# Patient Record
Sex: Female | Born: 1995
Health system: Southern US, Community
[De-identification: ages and names within clinical notes are randomized; demographics above are authoritative.]

## PROBLEM LIST (undated history)

## (undated) ENCOUNTER — Inpatient Hospital Stay (HOSPITAL_COMMUNITY): Payer: Self-pay

## (undated) DIAGNOSIS — O2300 Infections of kidney in pregnancy, unspecified trimester: Secondary | ICD-10-CM

## (undated) DIAGNOSIS — B009 Herpesviral infection, unspecified: Secondary | ICD-10-CM

## (undated) DIAGNOSIS — A749 Chlamydial infection, unspecified: Secondary | ICD-10-CM

## (undated) DIAGNOSIS — K219 Gastro-esophageal reflux disease without esophagitis: Secondary | ICD-10-CM

## (undated) DIAGNOSIS — B958 Unspecified staphylococcus as the cause of diseases classified elsewhere: Secondary | ICD-10-CM

## (undated) DIAGNOSIS — L309 Dermatitis, unspecified: Secondary | ICD-10-CM

## (undated) DIAGNOSIS — J45909 Unspecified asthma, uncomplicated: Secondary | ICD-10-CM

## (undated) HISTORY — PX: HERNIA REPAIR: SHX51

## (undated) HISTORY — PX: NO PAST SURGERIES: SHX2092

## (undated) SURGERY — Surgical Case
Anesthesia: *Unknown

---
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
## 2014-03-02 LAB — OB RESULTS CONSOLE ANTIBODY SCREEN: Antibody Screen: NEGATIVE

## 2014-03-02 LAB — OB RESULTS CONSOLE RPR: RPR: NONREACTIVE

## 2014-03-02 LAB — OB RESULTS CONSOLE ABO/RH: RH Type: POSITIVE

## 2014-03-02 LAB — OB RESULTS CONSOLE GC/CHLAMYDIA
Chlamydia: NEGATIVE
Gonorrhea: NEGATIVE

## 2014-03-02 LAB — OB RESULTS CONSOLE HEPATITIS B SURFACE ANTIGEN: Hepatitis B Surface Ag: NEGATIVE

## 2014-03-02 LAB — OB RESULTS CONSOLE RUBELLA ANTIBODY, IGM: Rubella: IMMUNE

## 2014-03-02 LAB — OB RESULTS CONSOLE HIV ANTIBODY (ROUTINE TESTING): HIV: NONREACTIVE

## 2014-06-29 LAB — OB RESULTS CONSOLE GBS: GBS: POSITIVE

## 2014-07-16 ENCOUNTER — Encounter (HOSPITAL_COMMUNITY): Payer: Self-pay

## 2014-07-16 ENCOUNTER — Inpatient Hospital Stay (HOSPITAL_COMMUNITY)
Admission: AD | Admit: 2014-07-16 | Discharge: 2014-07-18 | DRG: 781 | Disposition: A | Discharge status: Home / Self Care | Payer: Medicaid Other | Source: Ambulatory Visit | Attending: Obstetrics and Gynecology | Admitting: Obstetrics and Gynecology

## 2014-07-16 DIAGNOSIS — O26899 Other specified pregnancy related conditions, unspecified trimester: Secondary | ICD-10-CM | POA: Diagnosis present

## 2014-07-16 DIAGNOSIS — N12 Tubulo-interstitial nephritis, not specified as acute or chronic: Secondary | ICD-10-CM | POA: Diagnosis present

## 2014-07-16 DIAGNOSIS — O99891 Other specified diseases and conditions complicating pregnancy: Secondary | ICD-10-CM | POA: Diagnosis present

## 2014-07-16 DIAGNOSIS — N883 Incompetence of cervix uteri: Secondary | ICD-10-CM | POA: Diagnosis present

## 2014-07-16 DIAGNOSIS — O26879 Cervical shortening, unspecified trimester: Secondary | ICD-10-CM | POA: Diagnosis present

## 2014-07-16 DIAGNOSIS — R102 Pelvic and perineal pain: Secondary | ICD-10-CM

## 2014-07-16 DIAGNOSIS — O368390 Maternal care for abnormalities of the fetal heart rate or rhythm, unspecified trimester, not applicable or unspecified: Secondary | ICD-10-CM | POA: Diagnosis present

## 2014-07-16 DIAGNOSIS — O2300 Infections of kidney in pregnancy, unspecified trimester: Secondary | ICD-10-CM | POA: Diagnosis present

## 2014-07-16 DIAGNOSIS — D72829 Elevated white blood cell count, unspecified: Secondary | ICD-10-CM | POA: Diagnosis present

## 2014-07-16 DIAGNOSIS — O2390 Unspecified genitourinary tract infection in pregnancy, unspecified trimester: Principal | ICD-10-CM | POA: Diagnosis present

## 2014-07-16 DIAGNOSIS — Z87440 Personal history of urinary (tract) infections: Secondary | ICD-10-CM

## 2014-07-16 DIAGNOSIS — O9989 Other specified diseases and conditions complicating pregnancy, childbirth and the puerperium: Secondary | ICD-10-CM

## 2014-07-16 HISTORY — DX: Infections of kidney in pregnancy, unspecified trimester: O23.00

## 2014-07-16 LAB — COMPREHENSIVE METABOLIC PANEL
ALT: 9 U/L (ref 0–35)
AST: 24 U/L (ref 0–37)
Albumin: 3.3 g/dL — ABNORMAL LOW (ref 3.5–5.2)
Alkaline Phosphatase: 88 U/L (ref 39–117)
Anion gap: 19 — ABNORMAL HIGH (ref 5–15)
BUN: 7 mg/dL (ref 6–23)
CO2: 18 mEq/L — ABNORMAL LOW (ref 19–32)
Calcium: 9 mg/dL (ref 8.4–10.5)
Chloride: 94 mEq/L — ABNORMAL LOW (ref 96–112)
Creatinine, Ser: 0.74 mg/dL (ref 0.50–1.10)
GFR calc Af Amer: >90 mL/min (ref >90)
GFR calc non Af Amer: >90 mL/min (ref >90)
Glucose, Bld: 72 mg/dL (ref 70–99)
Potassium: 4 mEq/L (ref 3.7–5.3)
Sodium: 131 mEq/L — ABNORMAL LOW (ref 137–147)
Total Bilirubin: 1 mg/dL (ref 0.3–1.2)
Total Protein: 6.9 g/dL (ref 6.0–8.3)

## 2014-07-16 LAB — URINALYSIS, ROUTINE W REFLEX MICROSCOPIC
Glucose, UA: NEGATIVE mg/dL
Hgb urine dipstick: NEGATIVE
Ketones, ur: >80 mg/dL — AB
Nitrite: NEGATIVE
Protein, ur: NEGATIVE mg/dL
Specific Gravity, Urine: 1.01 (ref 1.005–1.030)
Urobilinogen, UA: 1 mg/dL (ref 0.0–1.0)
pH: 6.5 (ref 5.0–8.0)

## 2014-07-16 LAB — WET PREP, GENITAL
Clue Cells Wet Prep HPF POC: NONE SEEN
Trich, Wet Prep: NONE SEEN
Yeast Wet Prep HPF POC: NONE SEEN

## 2014-07-16 LAB — CBC WITH DIFFERENTIAL/PLATELET
Basophils Absolute: 0 10*3/uL (ref 0.0–0.1)
Basophils Relative: 0 % (ref 0–1)
Eosinophils Absolute: 0 10*3/uL (ref 0.0–0.7)
Eosinophils Relative: 0 % (ref 0–5)
HCT: 34.4 % — ABNORMAL LOW (ref 36.0–46.0)
Hemoglobin: 11.8 g/dL — ABNORMAL LOW (ref 12.0–15.0)
Lymphocytes Relative: 5 % — ABNORMAL LOW (ref 12–46)
Lymphs Abs: 1.1 10*3/uL (ref 0.7–4.0)
MCH: 28.8 pg (ref 26.0–34.0)
MCHC: 34.3 g/dL (ref 30.0–36.0)
MCV: 83.9 fL (ref 78.0–100.0)
Monocytes Absolute: 1.4 10*3/uL — ABNORMAL HIGH (ref 0.1–1.0)
Monocytes Relative: 6 % (ref 3–12)
Neutro Abs: 22.3 10*3/uL — ABNORMAL HIGH (ref 1.7–7.7)
Neutrophils Relative %: 89 % — ABNORMAL HIGH (ref 43–77)
Platelets: 230 10*3/uL (ref 150–400)
RBC: 4.1 MIL/uL (ref 3.87–5.11)
RDW: 13.9 % (ref 11.5–15.5)
WBC: 24.8 10*3/uL — ABNORMAL HIGH (ref 4.0–10.5)

## 2014-07-16 LAB — URINE MICROSCOPIC-ADD ON

## 2014-07-16 LAB — FETAL FIBRONECTIN: Fetal Fibronectin: NEGATIVE

## 2014-07-16 MED ORDER — LACTATED RINGERS IV SOLN
INTRAVENOUS | Status: DC
  Administered 2014-07-17 – 2014-07-18 (×3): via INTRAVENOUS

## 2014-07-16 MED ORDER — ZOLPIDEM TARTRATE 5 MG PO TABS
5.0000 mg | ORAL_TABLET | Freq: Every evening | ORAL | Status: DC | PRN
Start: 2014-07-16 — End: 2014-07-18

## 2014-07-16 MED ORDER — DOCUSATE SODIUM 100 MG PO CAPS
100.0000 mg | ORAL_CAPSULE | Freq: Every day | ORAL | Status: DC
  Administered 2014-07-17 – 2014-07-18 (×2): 100 mg via ORAL
  Filled 2014-07-16 (×3): qty 1

## 2014-07-16 MED ORDER — PRENATAL MULTIVITAMIN CH
1.0000 | ORAL_TABLET | Freq: Every day | ORAL | Status: DC
  Administered 2014-07-17 – 2014-07-18 (×2): 1 via ORAL
  Filled 2014-07-16 (×3): qty 1

## 2014-07-16 MED ORDER — CEFAZOLIN SODIUM 1-5 GM-% IV SOLN
1.0000 g | Freq: Three times a day (TID) | INTRAVENOUS | Status: DC
  Administered 2014-07-16 – 2014-07-18 (×5): 1 g via INTRAVENOUS
  Filled 2014-07-16 (×6): qty 50

## 2014-07-16 MED ORDER — ACETAMINOPHEN 500 MG PO TABS
1000.0000 mg | ORAL_TABLET | Freq: Once | ORAL | Status: AC
  Administered 2014-07-16: 1000 mg via ORAL
  Filled 2014-07-16: qty 2

## 2014-07-16 MED ORDER — CALCIUM CARBONATE ANTACID 500 MG PO CHEW
2.0000 | CHEWABLE_TABLET | ORAL | Status: DC | PRN
  Filled 2014-07-16: qty 1
  Filled 2014-07-16: qty 2
  Filled 2014-07-16: qty 1

## 2014-07-16 MED ORDER — LACTATED RINGERS IV BOLUS (SEPSIS)
500.0000 mL | Freq: Once | INTRAVENOUS | Status: AC
  Administered 2014-07-16: 22:00:00 via INTRAVENOUS

## 2014-07-16 MED ORDER — ACETAMINOPHEN 325 MG PO TABS
650.0000 mg | ORAL_TABLET | ORAL | Status: DC | PRN
  Filled 2014-07-16: qty 2

## 2014-07-16 NOTE — H&P (Signed)
Cheryl Logan is a 18 y.o. female, G1P0 at 28.2 weeks, presenting for gradual onset abdominal and back pain.  Patient reports pain started two days ago and has been progressively worsening.  Patient reports history of UTI and noted some issues with urination including hesitancy and retention as well as some cloudy colored urine on one occasion.  Patient with positive history of CT in Feb 2015 and at [redacted]wks GA, both adequately treated.  Patient also suffered numerous UTI with most recent at [facility], on 06/26/2014, which cultured K. Pneumoniae. Patient denies history of kidney stones and reports active fetus. Patient also denies ctx, vb, and lof.  Patient family admits to some cold-like illnesses noted in one family member that patient was recently in contact with.  However, patient denies issues with cough, nasal drainage, sinuses, or diarrhea.  Patient does admit to some constipation aeb difficulty passing stool, but last bowel movement was this evening.   There are no active problems to display for this patient.   History of present pregnancy: Patient entered care at 8.5 weeks and transferred to [facility] at 26.3wks.   EDC of 10/06/2014 was established by LMP and confirmed by 8.1wk US   Anatomy scan:  20.1 weeks, with normal findings and an Unknown placenta.   Additional [place] evaluations:  None.   Significant prenatal events:  Patient treated for CT infection at 13wks, TOC negative. Patient with ear infection at 13 wks.  Patient c/o UTI type symptoms  Last evaluation:  07/03/2014  OB History   Grav Para Term Preterm Abortions TAB SAB Ect Mult Living   1              Past Medical History  Diagnosis Date  . Eczema   . Asthma   . Infection     UTI   Past Surgical History  Procedure Laterality Date  . No past surgeries     Family History: family history is not on file. Social History:  reports that she has never smoked. She does not have any smokeless tobacco history on file. She reports that she  does not drink alcohol or use illicit drugs.   Prenatal Transfer Tool  Maternal Diabetes: Unknown Genetic Screening: Normal Maternal Ultrasounds/Referrals: Normal Fetal Ultrasounds or other Referrals:  None Maternal Substance Abuse:  No Significant Maternal Medications:  None Significant Maternal Lab Results: None    ROS:  See HPI Above  Allergies  Allergen Reactions  . Shellfish Allergy Anaphylaxis  . Peanut-Containing Drug Products Swelling     Dilation: Closed Effacement (%): Thick Station: Ballotable Exam by:: Sabas Sous CNM Blood pressure 113/68, pulse 138, temperature 99.8 F (37.7 C), temperature source Axillary, resp. rate 18, height  (1.575 m), weight 135 lb (61.236 kg), last menstrual period 12/04/2013, SpO2 100.00%.  Chest clear Heart RRR without murmur Abd gravid, NT Pelvic: C/T/B Ext: WNL  FHR: 160 bpm, Mod Var, -Decels, +Accels UCs:  None  Prenatal labs: ABO, Rh:  B Positive Antibody:  Negative Rubella:   Immune RPR:   Non Reactive  HBsAg:   Negative HIV:   Negative  GBS:  Unknown Sickle cell/Hgb electrophoresis:  Normal Solubility  Pap:  N/A GC:  Negative Chlamydia: H/O at 13 wks, TOC Negative Genetic screenings:  Normal Glucola:  Not Yet Performed Other:  TSH-Normal, Drug Screen-Negative    Assessment IUP at 28.1wks Pelvic Pain Back Pain Fetal Tachycardia  Plan: Admit to Antepartum for observation per consult with Dr. Dorris Carnes. [place] Routine Antepartum Orders per  [facility] Guidelines Ancef 1gram Q 8 Hours PO Tylenol Q 4 Hours Influenza and Strep Cultures Repeat CBC with Differential in AM Continuous fetal monitoring GC/CT Pending Consider abdominal/renal US in AM, to r/o kidney stones, if symptoms persist  Alyssamarie Mounsey LYNNCNM, MSN 07/16/2014, 10:52 PM

## 2014-07-16 NOTE — MAU Provider Note (Signed)
History   Patient is a 18 y.o. G1P0 at 28.1wks who presents with complaint of fever, abdominal pain, and back pain.  Patient also reports issues with urination stating that she had "cloudy" urine yesterday, but it was yellow today.  Patient also states that she has pain with urination and describes it as "just pain" and denies burning or hesitancy.  Patient reports onset of pain in last two days with symptoms worsening today.  Patient reports active fetus and denies LOF, VB, and ctx.   There are no active problems to display for this patient.   No chief complaint on file.  HPI  OB History   Grav Para Term Preterm Abortions TAB SAB Ect Mult Living   1               Past Medical History  Diagnosis Date  . Eczema   . Asthma   . Infection     UTI    Past Surgical History  Procedure Laterality Date  . No past surgeries      History reviewed. No pertinent family history.  History  Substance Use Topics  . Smoking status: Never Smoker   . Smokeless tobacco: Not on file  . Alcohol Use: No    Allergies:  Allergies  Allergen Reactions  . Shellfish Allergy Anaphylaxis  . Peanut-Containing Drug Products Swelling    Prescriptions prior to admission  Medication Sig Dispense Refill  . prenatal vitamin w/FE, FA (PRENATAL 1 + 1) 27-1 MG TABS tablet Take 1 tablet by mouth daily at 12 noon.      Marland Kitchen albuterol (PROVENTIL) (2.5 MG/3ML) 0.083% nebulizer solution Take 2.5 mg by nebulization every 6 (six) hours as needed. For cough or wheezing       . albuterol (PROVENTIL) (5 MG/ML) 0.5% nebulizer solution Take 2.5 mg by nebulization every 6 (six) hours as needed. For cough or wheezing       . cephALEXin (KEFLEX) 500 MG capsule Take 1 capsule (500 mg total) by mouth 3 (three) times daily.  21 capsule  0  . medroxyPROGESTERone (DEPO-PROVERA) 150 MG/ML injection Inject 150 mg into the muscle every 3 (three) months.        . metroNIDAZOLE (FLAGYL) 500 MG tablet Take 1 tablet (500 mg total) by  mouth 2 (two) times daily.  14 tablet  0  . promethazine (PHENERGAN) 12.5 MG tablet Take 1 tablet (12.5 mg total) by mouth every 6 (six) hours as needed for nausea or vomiting.  30 tablet  0  . triamcinolone cream (KENALOG) 0.1 % Apply 1 application topically daily as needed. For eczema          ROS  See HPI Above Physical Exam   Blood pressure 113/68, pulse 138, temperature 99.8 F (37.7 C), temperature source Axillary, resp. rate 18, height  (1.575 m), weight 135 lb (61.236 kg), last menstrual period 12/04/2013, SpO2 100.00%.   Physical Exam  Constitutional: She is oriented to person, place, and time. She appears well-developed and well-nourished. No distress.  Cardiovascular: Normal rate, regular rhythm and normal heart sounds.   Respiratory: Effort normal and breath sounds normal.  GI: Soft. Bowel sounds are normal. There is no tenderness. There is CVA tenderness. There is no rigidity, no guarding and negative Murphy's sign.  Appears gravid--fundal height appropriate for GA. NT    Musculoskeletal:       Lumbar back: She exhibits tenderness and pain.  Neurological: She is alert and oriented to person, place, and time.  Skin: Skin is warm and dry.   FHR:165 bpm, Mod Var, -Decels, +Accels UC: None graphed, fundus soft  ED Course  Assessment: IUP at 28.1wks Cat I FT Tachycardia Abdominal Pain R/O Pyelonephritis   Plan: -Labs: UA -Start IV, LR bolus  Follow Up (2143) -Patient reporting increased pelvic pain and pressure -Labs: GC/CT, Wet Prep Collected -Tylenol for maternal fever -Dr. Audree Camel consulted and advised to admit patient for observation   [facility], Lundon Verdejo LYNN CNM, MSN 07/16/2014 9:08 PM

## 2014-07-17 ENCOUNTER — Observation Stay (HOSPITAL_COMMUNITY): Payer: Medicaid Other

## 2014-07-17 ENCOUNTER — Encounter (HOSPITAL_COMMUNITY): Payer: Self-pay | Admitting: *Deleted

## 2014-07-17 DIAGNOSIS — O2390 Unspecified genitourinary tract infection in pregnancy, unspecified trimester: Secondary | ICD-10-CM | POA: Diagnosis present

## 2014-07-17 DIAGNOSIS — O2300 Infections of kidney in pregnancy, unspecified trimester: Secondary | ICD-10-CM | POA: Diagnosis present

## 2014-07-17 DIAGNOSIS — N12 Tubulo-interstitial nephritis, not specified as acute or chronic: Secondary | ICD-10-CM | POA: Diagnosis present

## 2014-07-17 DIAGNOSIS — Z87440 Personal history of urinary (tract) infections: Secondary | ICD-10-CM | POA: Diagnosis not present

## 2014-07-17 DIAGNOSIS — O26879 Cervical shortening, unspecified trimester: Secondary | ICD-10-CM | POA: Diagnosis present

## 2014-07-17 DIAGNOSIS — D72829 Elevated white blood cell count, unspecified: Secondary | ICD-10-CM | POA: Diagnosis present

## 2014-07-17 DIAGNOSIS — O99891 Other specified diseases and conditions complicating pregnancy: Secondary | ICD-10-CM | POA: Diagnosis present

## 2014-07-17 DIAGNOSIS — R109 Unspecified abdominal pain: Secondary | ICD-10-CM | POA: Diagnosis present

## 2014-07-17 DIAGNOSIS — O368390 Maternal care for abnormalities of the fetal heart rate or rhythm, unspecified trimester, not applicable or unspecified: Secondary | ICD-10-CM | POA: Diagnosis present

## 2014-07-17 HISTORY — DX: Infections of kidney in pregnancy, unspecified trimester: O23.00

## 2014-07-17 LAB — CBC WITH DIFFERENTIAL/PLATELET
Basophils Absolute: 0 10*3/uL (ref 0.0–0.1)
Basophils Relative: 0 % (ref 0–1)
Eosinophils Absolute: 0.1 10*3/uL (ref 0.0–0.7)
Eosinophils Relative: 0 % (ref 0–5)
HCT: 31.4 % — ABNORMAL LOW (ref 36.0–46.0)
Hemoglobin: 10.8 g/dL — ABNORMAL LOW (ref 12.0–15.0)
Lymphocytes Relative: 5 % — ABNORMAL LOW (ref 12–46)
Lymphs Abs: 0.9 10*3/uL (ref 0.7–4.0)
MCH: 28.7 pg (ref 26.0–34.0)
MCHC: 34.4 g/dL (ref 30.0–36.0)
MCV: 83.5 fL (ref 78.0–100.0)
Monocytes Absolute: 1.2 10*3/uL — ABNORMAL HIGH (ref 0.1–1.0)
Monocytes Relative: 6 % (ref 3–12)
Neutro Abs: 16.8 10*3/uL — ABNORMAL HIGH (ref 1.7–7.7)
Neutrophils Relative %: 89 % — ABNORMAL HIGH (ref 43–77)
Platelets: 219 10*3/uL (ref 150–400)
RBC: 3.76 MIL/uL — ABNORMAL LOW (ref 3.87–5.11)
RDW: 13.9 % (ref 11.5–15.5)
WBC: 19 10*3/uL — ABNORMAL HIGH (ref 4.0–10.5)

## 2014-07-17 LAB — INFLUENZA PANEL BY PCR (TYPE A & B)
H1N1 flu by pcr: NOT DETECTED
Influenza A By PCR: NEGATIVE
Influenza B By PCR: NEGATIVE

## 2014-07-17 LAB — RAPID STREP SCREEN (MED CTR MEBANE ONLY): Streptococcus, Group A Screen (Direct): NEGATIVE

## 2014-07-17 MED ORDER — LACTATED RINGERS IV BOLUS (SEPSIS)
300.0000 mL | Freq: Once | INTRAVENOUS | Status: AC
  Administered 2014-07-17: 09:00:00 via INTRAVENOUS

## 2014-07-17 MED ORDER — ACETAMINOPHEN 325 MG PO TABS
650.0000 mg | ORAL_TABLET | ORAL | Status: DC
  Filled 2014-07-17 (×3): qty 2

## 2014-07-17 MED ORDER — NIFEDIPINE 10 MG PO CAPS: 10.0000 mg | ORAL_CAPSULE | ORAL | Status: DC | PRN

## 2014-07-17 MED ORDER — NIFEDIPINE 10 MG PO CAPS
10.0000 mg | ORAL_CAPSULE | ORAL | Status: DC | PRN
  Administered 2014-07-17 (×2): 10 mg via ORAL
  Filled 2014-07-17 (×2): qty 1

## 2014-07-17 NOTE — Progress Notes (Signed)
Hospital day # 1 pregnancy at [redacted]w[redacted]d--Febrile illness, ? Pyelonephritis, FHR variables.  S:  Feeling less pain this am, but feels nauseated since po Procardia      Perception of contractions: None      Vaginal bleeding: None       Vaginal discharge:  None      Patient has been reluctant to take po meds--"just don't like to take meds"  O: BP 86/47  Pulse 126  Temp(Src) 98.4 F (36.9 C) (Oral)  Resp 18  Ht  (1.575 m)  Wt 135 lb (61.236 kg)  BMI 24.69 kg/m2  SpO2 98%  LMP 12/04/2013      Fetal tracings:  Category 2 for a section--series of moderate variables       associated with contractions from approx 6 am.  Patient       unaware of contractions.  Moderate variability throughout.      Tracing now Category 1, baseline 150-160, moderate variability.       IV bolus infused and Procardia 10 mg given at 0916.       BPP 8/8, with transverse presentation, normal fluid,       cervix 2.8 cm, some V-shaped funneling of internal       os noted.       Contractions:   q 5-8 min since approx 6am      Uterus Mildly tender, no rebound or guarding.      Extremities: no significant edema and no signs of DVT      SCDs on      Mild CVAT bilaterally.          Labs:   CBC Latest Ref Rng 07/17/2014 07/16/2014 12/11/2013  WBC 4.0 - 10.5 K/uL 19.0(H) 24.8(H) 11.5(H)  Hemoglobin 12.0 - 15.0 g/dL 10.8(L) 11.8(L) 14.3  Hematocrit 36.0 - 46.0 % 31.4(L) 34.4(L) 43.0  Platelets 150 - 400 K/uL 219 230 254   Influenza testing negative  Urine culture and GC/chlamydia pending.       Meds: acetaminophen, calcium carbonate, NIFEdipine, zolpidem                  . acetaminophen  650 mg Oral Q4H  .  ceFAZolin (ANCEF) IV  1 g Intravenous 3 times per day  . docusate sodium  100 mg Oral Daily  . prenatal multivitamin  1 tablet Oral Q1200    A: [redacted]w[redacted]d with fever, leukocytosis, back pain.     Category 1 FHR now     Stable  P: Continue current plan of care      Reviewed plan of care with patient and  partner, including need for meds.      Upcoming tests/treatments:  Continue Ancef, continuous EFM.      MDs will follow--will consult with Dr. Estanislado Pandy regarding plan of care.      Await urine culture and GC/chlamydia.  Nigel Bridgeman CNM, [place] 07/17/2014 10:51 AM

## 2014-07-17 NOTE — Progress Notes (Signed)
Ur chart review completed.  

## 2014-07-17 NOTE — Progress Notes (Addendum)
Re-evaluation of FHR tracing--Category 1 now.   No decels since around 11:30am. Only sporadic contractions now, mild irritability. Received 2 doses Procardia, 0916 and 1300. Patient declines Tylenol for back pain.  Urine culture and GC/chlamydia pending.  Filed Vitals:   07/17/14 1142 07/17/14 1147 07/17/14 1148 07/17/14 1157  BP:  87/59    Pulse: 110 139 112 106  Temp:  98.7 F (37.1 C)    TempSrc:  Oral    Resp:  20    Height:      Weight:      SpO2:  99%     Will CTO. Complete 24 hours of ATB. Await culture results. Continuous EFM.  Nigel Bridgeman, CNM 07/17/14 2:40p  Seen and agreed Will continue same care until 48 hours afebrile Will recheck WBC in am Patient voiced understanding

## 2014-07-18 DIAGNOSIS — N883 Incompetence of cervix uteri: Secondary | ICD-10-CM | POA: Diagnosis present

## 2014-07-18 LAB — CBC WITH DIFFERENTIAL/PLATELET
Basophils Absolute: 0 10*3/uL (ref 0.0–0.1)
Basophils Relative: 0 % (ref 0–1)
Eosinophils Absolute: 0.4 10*3/uL (ref 0.0–0.7)
Eosinophils Relative: 4 % (ref 0–5)
HCT: 31.8 % — ABNORMAL LOW (ref 36.0–46.0)
Hemoglobin: 10.6 g/dL — ABNORMAL LOW (ref 12.0–15.0)
Lymphocytes Relative: 16 % (ref 12–46)
Lymphs Abs: 1.5 10*3/uL (ref 0.7–4.0)
MCH: 28.3 pg (ref 26.0–34.0)
MCHC: 33.3 g/dL (ref 30.0–36.0)
MCV: 84.8 fL (ref 78.0–100.0)
Monocytes Absolute: 1.2 10*3/uL — ABNORMAL HIGH (ref 0.1–1.0)
Monocytes Relative: 12 % (ref 3–12)
Neutro Abs: 6.5 10*3/uL (ref 1.7–7.7)
Neutrophils Relative %: 68 % (ref 43–77)
Platelets: 229 10*3/uL (ref 150–400)
RBC: 3.75 MIL/uL — ABNORMAL LOW (ref 3.87–5.11)
RDW: 14.3 % (ref 11.5–15.5)
WBC: 9.6 10*3/uL (ref 4.0–10.5)

## 2014-07-18 LAB — GC/CHLAMYDIA PROBE AMP
CT Probe RNA: NEGATIVE
GC Probe RNA: NEGATIVE

## 2014-07-18 MED ORDER — CEPHALEXIN 500 MG PO CAPS: 500.0000 mg | ORAL_CAPSULE | Freq: Two times a day (BID) | ORAL | Status: AC

## 2014-07-18 MED ORDER — CEPHALEXIN 500 MG PO CAPS
500.0000 mg | ORAL_CAPSULE | Freq: Once | ORAL | Status: AC
  Administered 2014-07-18: 500 mg via ORAL
  Filled 2014-07-18: qty 1

## 2014-07-18 NOTE — Progress Notes (Signed)
Pt refusing flu vaccine at this time. Patient educated on importance of vaccination by RN. States, "I've had enough this week, maybe next week". Will report to CNM, Nigel Bridgeman.

## 2014-07-18 NOTE — Discharge Summary (Signed)
Physician Discharge Summary  Patient ID: Cheryl Logan MRN: [id number] DOB/AGE: [birth year] 18 y.o.  Admit date: 07/16/2014 Discharge date: 07/18/2014  Admission Diagnoses:  IUP at 28 3/7 weeks, fever  Discharge Diagnoses:  Active Problems:   Pyelonephritis complicating pregnancy, antepartum   Short cervix   Discharged Condition: Good  Hospital Course: Presented at 19 1/7 weeks with fever, back pain, abdominal pain, and urinary hesitancy/frequency.  Hx of recurrent UTIs, with recent K. Pneumonia culture on 06/27/14.  WBC count was 24.8, with temp to of 99.8.  Admitted for IV hydration, Ancef, and fetal monitoring.  Cervix was closed, with FFN negative.  On the next morning, she was noted to have UCs q 5-8 min, with variables noted.  BPP was 8/8, with normal fluid, transverse lie, and cervix 2.8 cm with V-shaped funneling noted.  UCs resolved with Procardia x 2 doses and IV hydration.  FHR was subsequently reassuring.  By the morning of 9/15, patient had been afebrile x 48 hours and was feeling well, with resolution of abdominal and back pain.  WBC count had come down to normal levels.  She was d/c'd home on pelvic rest, Keflex 500 mg po BID x 7 days, and to have f/u visit at [facility] in 1 week.  Cervical cultures and urine culture were done, with results pending at the time of d/c.    Consults: None  Significant Diagnostic Studies: labs:  Results for orders placed during the hospital encounter of 07/16/14 (from the past 24 hour(s))  CBC WITH DIFFERENTIAL     Status: Abnormal   Collection Time    07/18/14  6:13 AM      Result Value Ref Range   WBC 9.6  4.0 - 10.5 K/uL   RBC 3.75 (*) 3.87 - 5.11 MIL/uL   Hemoglobin 10.6 (*) 12.0 - 15.0 g/dL   HCT 40.9 (*) 81.1 - 91.4 %   MCV 84.8  78.0 - 100.0 fL   MCH 28.3  26.0 - 34.0 pg   MCHC 33.3  30.0 - 36.0 g/dL   RDW 78.2  95.6 - 21.3 %   Platelets 229  150 - 400 K/uL   Neutrophils Relative % 68  43 - 77 %   Neutro Abs 6.5  1.7 - 7.7 K/uL   Lymphocytes Relative 16  12 - 46 %   Lymphs Abs 1.5  0.7 - 4.0 K/uL   Monocytes Relative 12  3 - 12 %   Monocytes Absolute 1.2 (*) 0.1 - 1.0 K/uL   Eosinophils Relative 4  0 - 5 %   Eosinophils Absolute 0.4  0.0 - 0.7 K/uL   Basophils Relative 0  0 - 1 %   Basophils Absolute 0.0  0.0 - 0.1 K/uL    and BPP/AFI WNL  Treatments: IV hydration and antibiotics: Ancef  Discharge Exam: Blood pressure 90/49, pulse 100, temperature 98 F (36.7 C), temperature source Axillary, resp. rate 18, height  (1.575 m), weight 135 lb (61.236 kg), last menstrual period 12/04/2013, SpO2 98.00%. General appearance: alert Resp: clear to auscultation bilaterally Cardio: regular rate and rhythm, S1, S2 normal, no murmur, click, rub or gallop Pelvic: uterus normal size, shape, and consistency Extremities: extremities normal, atraumatic, no cyanosis or edema Cervical exam deferred per Dr. Normand Sloop. FHR reassuring. Occasional, mild contractions.  Disposition: 01-Home or Self Care     Medication List         albuterol (2.5 MG/3ML) 0.083% nebulizer solution  Commonly known as:  PROVENTIL  Take  2.5 mg by nebulization every 6 (six) hours as needed for wheezing or shortness of breath.     albuterol 108 (90 BASE) MCG/ACT inhaler  Commonly known as:  PROVENTIL HFA;VENTOLIN HFA  Inhale 2 puffs into the lungs every 6 (six) hours as needed for wheezing or shortness of breath.     cephALEXin 500 MG capsule  Commonly known as:  KEFLEX  Take 1 capsule (500 mg total) by mouth 2 (two) times daily.     clindamycin-benzoyl peroxide gel  Commonly known as:  BENZACLIN  Apply 1 application topically daily as needed (for yeast infection.). Pt states that she was using this to keep yeast down.     prenatal multivitamin Tabs tablet  Take 1 tablet by mouth daily.     VITAMIN D PO  Take 1 each by mouth 2 (two) times a week.       Follow-up Information   Follow up with [facility].  Schedule an appointment as soon as possible for a visit in 1 week. (Office will call you to schedule an appointment for next week.)    Specialty:  Obstetrics and Gynecology   Contact information:   [address] [place] [address] [phone]      Signed: Nigel Bridgeman 07/18/2014, 12:20 PM

## 2014-07-18 NOTE — Discharge Instructions (Signed)
Pyelonephritis, Adult Pyelonephritis is a kidney infection. A kidney infection can happen quickly, or it can last for a long time. HOME CARE   Take your medicine (antibiotics) as told. Finish it even if you start to feel better.  Keep all doctor visits as told.  Drink enough fluids to keep your pee (urine) clear or pale yellow.  Only take medicine as told by your doctor. GET HELP RIGHT AWAY IF:   You have a fever or lasting symptoms for more than 2-3 days.  You have a fever and your symptoms suddenly get worse.  You cannot take your medicine or drink fluids as told.  You have chills and shaking.  You feel very weak or pass out (faint).  You do not feel better after 2 days. MAKE SURE YOU:  Understand these instructions.  Will watch your condition.  Will get help right away if you are not doing well or get worse. Document Released: 11/27/2004 Document Revised: 04/20/2012 Document Reviewed: 04/09/2011 [facility] Patient Information 2015 [place], [place]. This information is not intended to replace advice given to you by your health care provider. Make sure you discuss any questions you have with your health care provider.   Preterm Labor Information Preterm labor is when labor starts at less than 37 weeks of pregnancy. The normal length of a pregnancy is 39 to 41 weeks. CAUSES Often, there is no identifiable underlying cause as to why a woman goes into preterm labor. One of the most common known causes of preterm labor is infection. Infections of the uterus, cervix, vagina, amniotic sac, bladder, kidney, or even the lungs (pneumonia) can cause labor to start. Other suspected causes of preterm labor include:   Urogenital infections, such as yeast infections and bacterial vaginosis.   Uterine abnormalities (uterine shape, uterine septum, fibroids, or bleeding from the placenta).   A cervix that has been operated on (it may fail to stay closed).   Malformations in the fetus.    Multiple gestations (twins, triplets, and so on).   Breakage of the amniotic sac.  RISK FACTORS  Having a previous history of preterm labor.   Having premature rupture of membranes (PROM).   Having a placenta that covers the opening of the cervix (placenta previa).   Having a placenta that separates from the uterus (placental abruption).   Having a cervix that is too weak to hold the fetus in the uterus (incompetent cervix).   Having too much fluid in the amniotic sac (polyhydramnios).   Taking illegal drugs or smoking while pregnant.   Not gaining enough weight while pregnant.   Being younger than 61 and older than 19 years old.   Having a low socioeconomic status.   Being African American. SYMPTOMS Signs and symptoms of preterm labor include:   Menstrual-like cramps, abdominal pain, or back pain.  Uterine contractions that are regular, as frequent as six in an hour, regardless of their intensity (may be mild or painful).  Contractions that start on the top of the uterus and spread down to the lower abdomen and back.   A sense of increased pelvic pressure.   A watery or bloody mucus discharge that comes from the vagina.  TREATMENT Depending on the length of the pregnancy and other circumstances, your health care provider may suggest bed rest. If necessary, there are medicines that can be given to stop contractions and to mature the fetal lungs. If labor happens before 34 weeks of pregnancy, a prolonged hospital stay may be recommended.  Treatment depends on the condition of both you and the fetus.  WHAT SHOULD YOU DO IF YOU THINK YOU ARE IN PRETERM LABOR? Call your health care provider right away. You will need to go to the hospital to get checked immediately. HOW CAN YOU PREVENT PRETERM LABOR IN FUTURE PREGNANCIES? You should:   Stop smoking if you smoke.  Maintain healthy weight gain and avoid chemicals and drugs that are not necessary.  Be  watchful for any type of infection.  Inform your health care provider if you have a known history of preterm labor. Document Released: 01/10/2004 Document Revised: 06/22/2013 Document Reviewed: 11/22/2012 [facility] Patient Information 2015 [place], [place]. This information is not intended to replace advice given to you by your health care provider. Make sure you discuss any questions you have with your health care provider.

## 2014-07-19 LAB — CULTURE, GROUP A STREP

## 2014-07-20 LAB — CULTURE, OB URINE: Colony Count: 2000

## 2014-08-07 ENCOUNTER — Inpatient Hospital Stay (HOSPITAL_COMMUNITY)
Admission: AD | Admit: 2014-08-07 | Discharge: 2014-08-07 | Disposition: A | Discharge status: Home / Self Care | Payer: Medicaid Other | Source: Ambulatory Visit | Attending: Obstetrics and Gynecology | Admitting: Obstetrics and Gynecology

## 2014-08-07 ENCOUNTER — Encounter (HOSPITAL_COMMUNITY): Payer: Self-pay | Admitting: *Deleted

## 2014-08-07 DIAGNOSIS — O26873 Cervical shortening, third trimester: Secondary | ICD-10-CM | POA: Insufficient documentation

## 2014-08-07 DIAGNOSIS — O26853 Spotting complicating pregnancy, third trimester: Secondary | ICD-10-CM | POA: Diagnosis present

## 2014-08-07 DIAGNOSIS — Z3A31 31 weeks gestation of pregnancy: Secondary | ICD-10-CM | POA: Insufficient documentation

## 2014-08-07 DIAGNOSIS — O2340 Unspecified infection of urinary tract in pregnancy, unspecified trimester: Secondary | ICD-10-CM

## 2014-08-07 DIAGNOSIS — B951 Streptococcus, group B, as the cause of diseases classified elsewhere: Secondary | ICD-10-CM

## 2014-08-07 LAB — URINALYSIS, ROUTINE W REFLEX MICROSCOPIC
Glucose, UA: NEGATIVE mg/dL
Hgb urine dipstick: NEGATIVE
Ketones, ur: 40 mg/dL — AB
Leukocytes, UA: NEGATIVE
Nitrite: NEGATIVE
Protein, ur: 100 mg/dL — AB
Specific Gravity, Urine: 1.025 (ref 1.005–1.030)
Urobilinogen, UA: 2 mg/dL — ABNORMAL HIGH (ref 0.0–1.0)
pH: 6 (ref 5.0–8.0)

## 2014-08-07 LAB — URINE MICROSCOPIC-ADD ON

## 2014-08-07 MED ORDER — NIFEDIPINE 10 MG PO CAPS
10.0000 mg | ORAL_CAPSULE | ORAL | Status: DC | PRN
  Administered 2014-08-07 (×2): 10 mg via ORAL
  Filled 2014-08-07 (×2): qty 1

## 2014-08-07 MED ORDER — LACTATED RINGERS IV SOLN
INTRAVENOUS | Status: DC
  Administered 2014-08-07 (×2): via INTRAVENOUS

## 2014-08-07 MED ORDER — ZOLPIDEM TARTRATE 5 MG PO TABS
5.0000 mg | ORAL_TABLET | Freq: Once | ORAL | Status: AC
  Administered 2014-08-07: 5 mg via ORAL
  Filled 2014-08-07: qty 1

## 2014-08-07 MED ORDER — CYCLOBENZAPRINE HCL 10 MG PO TABS: 10.0000 mg | ORAL_TABLET | Freq: Three times a day (TID) | ORAL | Status: DC | PRN

## 2014-08-07 NOTE — Discharge Instructions (Signed)
Preterm Labor Information Preterm labor is when labor starts before you are [redacted] weeks pregnant. The normal length of pregnancy is 39 to 41 weeks.  CAUSES  The cause of preterm labor is not often known. The most common known cause is infection. RISK FACTORS  Having a history of preterm labor.  Having your water break before it should.  Having a placenta that covers the opening of the cervix.  Having a placenta that breaks away from the uterus.  Having a cervix that is too weak to hold the baby in the uterus.  Having too much fluid in the amniotic sac.  Taking drugs or smoking while pregnant.  Not gaining enough weight while pregnant.  Being younger than 18 and older than 19 years old.  Having a low income.  Being African American. SYMPTOMS  Period-like cramps, belly (abdominal) pain, or back pain.  Contractions that are regular, as often as six in an hour. They may be mild or painful.  Contractions that start at the top of the belly. They then move to the lower belly and back.  Lower belly pressure that seems to get stronger.  Bleeding from the vagina.  Fluid leaking from the vagina. TREATMENT  Treatment depends on:  Your condition.  The condition of your baby.  How many weeks pregnant you are. Your doctor may have you:  Take medicine to stop contractions.  Stay in bed except to use the restroom (bed rest).  Stay in the hospital. WHAT SHOULD YOU DO IF YOU THINK YOU ARE IN PRETERM LABOR? Call your doctor right away. You need to go to the hospital right away.  HOW CAN YOU PREVENT PRETERM LABOR IN FUTURE PREGNANCIES?  Stop smoking, if you smoke.  Maintain healthy weight gain.  Do not take drugs or be around chemicals that are not needed.  Tell your doctor if you think you have an infection.  Tell your doctor if you had a preterm labor before. Document Released: 01/16/2009 Document Revised: 08/10/2013 Document Reviewed: 01/16/2009 [employer] Patient  Information 2015 [employer]. This information is not intended to replace advice given to you by your health care provider. Make sure you discuss any questions you have with your health care provider.  

## 2014-08-07 NOTE — MAU Provider Note (Signed)
[name] is a 18 y.o. G1P0 at 31.2 weeks. Presents to the office after calling the office.  She report on Saturday she has spottingm itching and burning.  On Sunday she starting cramping and sharp pain and today pelvic pain 7/10 and white cream thick discharge.  She denies pain with each ctx.  She has a hx of a short cervix.  Denies LOF or any further vb.    History     Patient Active Problem List   Diagnosis Date Noted  . Short cervix 07/18/2014  . Pyelonephritis complicating pregnancy, antepartum 07/17/2014    No chief complaint on file.  HPI  OB History   Grav Para Term Preterm Abortions TAB SAB Ect Mult Living   1               Past Medical History  Diagnosis Date  . Eczema   . Asthma   . Infection     UTI  . Pyelonephritis complicating pregnancy, antepartum 07/17/2014    Past Surgical History  Procedure Laterality Date  . No past surgeries      No family history on file.  History  Substance Use Topics  . Smoking status: Never Smoker   . Smokeless tobacco: Not on file  . Alcohol Use: No    Allergies:  Allergies  Allergen Reactions  . Shellfish Allergy Anaphylaxis  . Peanut-Containing Drug Products Swelling    Prescriptions prior to admission  Medication Sig Dispense Refill  . albuterol (PROVENTIL HFA;VENTOLIN HFA) 108 (90 BASE) MCG/ACT inhaler Inhale 2 puffs into the lungs every 6 (six) hours as needed for wheezing or shortness of breath.      Marland Kitchen. albuterol (PROVENTIL) (2.5 MG/3ML) 0.083% nebulizer solution Take 2.5 mg by nebulization every 6 (six) hours as needed for wheezing or shortness of breath.       . Cholecalciferol (VITAMIN D PO) Take 1 each by mouth 2 (two) times a week.      . clindamycin-benzoyl peroxide (BENZACLIN) gel Apply 1 application topically daily as needed (for yeast infection.). Pt states that she was using this to keep yeast down.      . Prenatal Vit-Fe Fumarate-FA (PRENATAL MULTIVITAMIN) TABS tablet Take 1 tablet by mouth  daily.        ROS See HPI above, all other systems are negative  Physical Exam   Last menstrual period 12/04/2013.  Physical Exam Ext:  WNL ABD: Soft, non tender to palpation, no rebound or guarding SVE: 1/T/H in the office today FFN not done d/t VE   ED Course  Assessment: IUP at  31.2weeks Membranes: intact FHR: Category 1  CTX:  3-4 minutes   Plan: Extensive fetal monitoring Consult with Dr. Sallye OberKulwa FFN in 2 days IV fluid  Fawnda Vitullo, CNM, MSN 08/07/2014. 5:07 PM

## 2014-08-07 NOTE — MAU Note (Signed)
Patient presents to MAU with pelvic pain that is constant. Reports vaginal bleeding that is spotting on Saturday but none today. Denies LOF or contractions at this time. +FM

## 2014-08-07 NOTE — MAU Provider Note (Signed)
Resting quietly on right side--reports does not feel contractions.   Denies nausea. Several family members at bedside.  Filed Vitals:   08/07/14 1743  BP: 111/68  Pulse: 115  Temp: 98 F (36.7 C)  TempSrc: Oral  Resp: 18  Height: 5\' 2"  (1.575 m)  Weight: 135 lb (61.236 kg)  SpO2: 99%   Results for orders placed during the hospital encounter of 08/07/14 (from the past 24 hour(s))  URINALYSIS, ROUTINE W REFLEX MICROSCOPIC     Status: Abnormal   Collection Time    08/07/14  5:30 PM      Result Value Ref Range   Color, Urine ORANGE (*) YELLOW   APPearance HAZY (*) CLEAR   Specific Gravity, Urine 1.025  1.005 - 1.030   pH 6.0  5.0 - 8.0   Glucose, UA NEGATIVE  NEGATIVE mg/dL   Hgb urine dipstick NEGATIVE  NEGATIVE   Bilirubin Urine SMALL (*) NEGATIVE   Ketones, ur 40 (*) NEGATIVE mg/dL   Protein, ur 562100 (*) NEGATIVE mg/dL   Urobilinogen, UA 2.0 (*) 0.0 - 1.0 mg/dL   Nitrite NEGATIVE  NEGATIVE   Leukocytes, UA NEGATIVE  NEGATIVE  URINE MICROSCOPIC-ADD ON     Status: Abnormal   Collection Time    08/07/14  5:30 PM      Result Value Ref Range   Squamous Epithelial / LPF MANY (*) RARE   WBC, UA 0-2  <3 WBC/hpf   Urine-Other MUCOUS PRESENT     FHR Category 1 UCs q 6-8 min, mild.  IV rate increased to give full 1000 cc over next hr, due to ketones. Proteinuria noted, may be contaminated specimen.  Urine culture, GC/chlamydia added to urine testing.  Will give Procardia 10 mg regimen and complete current IV bag. Consulted with Dr. Su Hiltoberts. Will defer ATB Rx pending urine culture results.  Nigel BridgemanVicki Kasaundra Fahrney, CNM 08/07/14 7:30p  Addendum: Now c/o round ligament pain, but not aware of any UCs. FHR Category 1 UCs none Cervix 1 cm, thick, vtx -3 (no change from office exam)  D/C home with PTL precautions, increase fluids and rest, pelvic rest. Ambien 5 mg now Rx Flexeril 10 mg po TID prn. Office will call patient tomorrow to schedule appt on Wednesday for FFN  testing.  Nigel BridgemanVicki Bleu Moisan, CNM 08/07/14 9p

## 2014-08-09 ENCOUNTER — Other Ambulatory Visit: Payer: Self-pay | Admitting: Obstetrics and Gynecology

## 2014-08-09 ENCOUNTER — Inpatient Hospital Stay (HOSPITAL_COMMUNITY)
Admission: AD | Admit: 2014-08-09 | Discharge: 2014-08-09 | Disposition: A | Discharge status: Home / Self Care | Payer: Medicaid Other | Source: Ambulatory Visit | Attending: Obstetrics and Gynecology | Admitting: Obstetrics and Gynecology

## 2014-08-09 DIAGNOSIS — Z3A31 31 weeks gestation of pregnancy: Secondary | ICD-10-CM | POA: Insufficient documentation

## 2014-08-09 MED ORDER — BETAMETHASONE SOD PHOS & ACET 6 (3-3) MG/ML IJ SUSP
12.0000 mg | INTRAMUSCULAR | Status: DC
  Administered 2014-08-09: 12 mg via INTRAMUSCULAR
  Filled 2014-08-09: qty 2

## 2014-08-10 ENCOUNTER — Inpatient Hospital Stay (HOSPITAL_COMMUNITY)
Admission: AD | Admit: 2014-08-10 | Discharge: 2014-08-10 | Disposition: A | Discharge status: Home / Self Care | Payer: Medicaid Other | Source: Ambulatory Visit | Attending: Obstetrics and Gynecology | Admitting: Obstetrics and Gynecology

## 2014-08-10 DIAGNOSIS — Z3A31 31 weeks gestation of pregnancy: Secondary | ICD-10-CM | POA: Diagnosis not present

## 2014-08-10 LAB — CULTURE, OB URINE: Colony Count: 10000

## 2014-08-10 LAB — GC/CHLAMYDIA PROBE AMP
CT Probe RNA: NEGATIVE
GC Probe RNA: NEGATIVE

## 2014-08-10 MED ORDER — BETAMETHASONE SOD PHOS & ACET 6 (3-3) MG/ML IJ SUSP
12.0000 mg | Freq: Once | INTRAMUSCULAR | Status: AC
  Administered 2014-08-10: 12 mg via INTRAMUSCULAR
  Filled 2014-08-10: qty 2

## 2014-08-10 NOTE — MAU Note (Signed)
Pt here for 2nd BMZ shot. Denies pain, LOF, or vaginal bleeding. Positive fetal movement.

## 2014-08-11 ENCOUNTER — Encounter (HOSPITAL_COMMUNITY): Payer: Self-pay | Admitting: *Deleted

## 2014-08-11 ENCOUNTER — Inpatient Hospital Stay (HOSPITAL_COMMUNITY)
Admission: AD | Admit: 2014-08-11 | Discharge: 2014-08-11 | Disposition: A | Discharge status: Home / Self Care | Payer: Medicaid Other | Source: Ambulatory Visit | Attending: Obstetrics & Gynecology | Admitting: Obstetrics & Gynecology

## 2014-08-11 ENCOUNTER — Telehealth: Payer: Self-pay | Admitting: Advanced Practice Midwife

## 2014-08-11 ENCOUNTER — Other Ambulatory Visit: Payer: Self-pay | Admitting: Advanced Practice Midwife

## 2014-08-11 DIAGNOSIS — O99820 Streptococcus B carrier state complicating pregnancy: Secondary | ICD-10-CM | POA: Diagnosis not present

## 2014-08-11 DIAGNOSIS — O9989 Other specified diseases and conditions complicating pregnancy, childbirth and the puerperium: Secondary | ICD-10-CM | POA: Insufficient documentation

## 2014-08-11 DIAGNOSIS — Z3A32 32 weeks gestation of pregnancy: Secondary | ICD-10-CM | POA: Diagnosis not present

## 2014-08-11 LAB — URINALYSIS, ROUTINE W REFLEX MICROSCOPIC
Bilirubin Urine: NEGATIVE
Glucose, UA: NEGATIVE mg/dL
Hgb urine dipstick: NEGATIVE
Ketones, ur: NEGATIVE mg/dL
Leukocytes, UA: NEGATIVE
Nitrite: NEGATIVE
Protein, ur: NEGATIVE mg/dL
Specific Gravity, Urine: 1.015 (ref 1.005–1.030)
Urobilinogen, UA: 0.2 mg/dL (ref 0.0–1.0)
pH: 7 (ref 5.0–8.0)

## 2014-08-11 LAB — WET PREP, GENITAL
Clue Cells Wet Prep HPF POC: NONE SEEN
Trich, Wet Prep: NONE SEEN
Yeast Wet Prep HPF POC: NONE SEEN

## 2014-08-11 MED ORDER — AMOXICILLIN 500 MG PO CAPS: 500.0000 mg | ORAL_CAPSULE | Freq: Three times a day (TID) | ORAL | Status: DC

## 2014-08-11 MED ORDER — ZOLPIDEM TARTRATE ER 12.5 MG PO TBCR: 12.5000 mg | EXTENDED_RELEASE_TABLET | Freq: Every evening | ORAL | Status: DC | PRN

## 2014-08-11 NOTE — MAU Note (Signed)
Patient states she was called today and told she has a UTI but has not picked up the Rx. Also states she had a positive fFn and has had Betamethasone injection the last 2 days.

## 2014-08-11 NOTE — MAU Provider Note (Signed)
MAU Addendum Note  Results for orders placed during the hospital encounter of 08/11/14 (from the past 24 hour(s))  URINALYSIS, ROUTINE W REFLEX MICROSCOPIC     Status: None   Collection Time    08/11/14  3:20 PM      Result Value Ref Range   Color, Urine YELLOW  YELLOW   APPearance CLEAR  CLEAR   Specific Gravity, Urine 1.015  1.005 - 1.030   pH 7.0  5.0 - 8.0   Glucose, UA NEGATIVE  NEGATIVE mg/dL   Hgb urine dipstick NEGATIVE  NEGATIVE   Bilirubin Urine NEGATIVE  NEGATIVE   Ketones, ur NEGATIVE  NEGATIVE mg/dL   Protein, ur NEGATIVE  NEGATIVE mg/dL   Urobilinogen, UA 0.2  0.0 - 1.0 mg/dL   Nitrite NEGATIVE  NEGATIVE   Leukocytes, UA NEGATIVE  NEGATIVE  WET PREP, GENITAL     Status: Abnormal   Collection Time    08/11/14  4:05 PM      Result Value Ref Range   Yeast Wet Prep HPF POC NONE SEEN  NONE SEEN   Trich, Wet Prep NONE SEEN  NONE SEEN   Clue Cells Wet Prep HPF POC NONE SEEN  NONE SEEN   WBC, Wet Prep HPF POC FEW (*) NONE SEEN    Consulted with Dr. Sallye OberKulwa DC to home with PTL precaution and kick counts FU in the office in 1 week Pick up ABX medication at pharmacy Ambien 12.5mg  [facility] RPN   Cheryl Logan, CNM, MSN 08/11/2014. 5:58 PM

## 2014-08-11 NOTE — Telephone Encounter (Signed)
Talked with pt about abx Rx, amoxicillin 500 mg BID sent to pharmacy for GBS positive urine.

## 2014-08-11 NOTE — MAU Provider Note (Signed)
[name] is a 18 y.o. G1P0 at 32.0 weeks c/o ctx q 5 minutes.  Pt was in MAU on 10/5 for ctx, VE 1/T/H.  She was given IVF and procardia in MAU and sent home with Ambien and flexeril.  FFN on 10/7 was positive. BMZ complete on 08/11/14.  Pt report she received a phone call today regarding her urine culture.  She was not clear on the type of infection and has not picked up the rx yet.  The records shows GBS+ in urine.     History     Patient Active Problem List   Diagnosis Date Noted  . GBS (group B streptococcus) UTI complicating pregnancy--07/16/14 08/07/2014  . Short cervix 07/18/2014  . Pyelonephritis complicating pregnancy, antepartum 07/17/2014    No chief complaint on file.  HPI  OB History   Grav Para Term Preterm Abortions TAB SAB Ect Mult Living   1               Past Medical History  Diagnosis Date  . Eczema   . Asthma   . Infection     UTI  . Pyelonephritis complicating pregnancy, antepartum 07/17/2014    Past Surgical History  Procedure Laterality Date  . No past surgeries      No family history on file.  History  Substance Use Topics  . Smoking status: Never Smoker   . Smokeless tobacco: Not on file  . Alcohol Use: No    Allergies:  Allergies  Allergen Reactions  . Shellfish Allergy Anaphylaxis  . Peanut-Containing Drug Products Swelling    Prescriptions prior to admission  Medication Sig Dispense Refill  . albuterol (PROVENTIL HFA;VENTOLIN HFA) 108 (90 BASE) MCG/ACT inhaler Inhale 2 puffs into the lungs every 6 (six) hours as needed for wheezing or shortness of breath.      Marland Kitchen. albuterol (PROVENTIL) (2.5 MG/3ML) 0.083% nebulizer solution Take 2.5 mg by nebulization every 6 (six) hours as needed for wheezing or shortness of breath.       Marland Kitchen. amoxicillin (AMOXIL) 500 MG capsule Take 1 capsule (500 mg total) by mouth 3 (three) times daily.  21 capsule  0  . calcium carbonate (TUMS - DOSED IN MG ELEMENTAL CALCIUM) 500 MG chewable tablet Chew 2-3  tablets by mouth as needed for indigestion or heartburn.      . clindamycin-benzoyl peroxide (BENZACLIN) gel Apply 1 application topically daily.       . cyclobenzaprine (FLEXERIL) 10 MG tablet Take 1 tablet (10 mg total) by mouth 3 (three) times daily as needed for muscle spasms.  30 tablet  0  . Prenatal Vit-Fe Fumarate-FA (PRENATAL MULTIVITAMIN) TABS tablet Take 1 tablet by mouth daily.        ROS See HPI above, all other systems are negative  Physical Exam   Last menstrual period 12/04/2013.  Physical Exam  Ext:  WNL ABD: Soft, non tender to palpation, no rebound or guarding SVE: 1/T/H   ED Course  Assessment: IUP at  32.0weeks Membranes: intact FHR: Reactive CTX:  None, irribility  Plan: Consult with Dr. Sallye OberKulwa UA Urine culture   Jozlynn Plaia, CNM, MSN 08/11/2014. 2:41 PM

## 2014-08-11 NOTE — MAU Note (Signed)
Patient is not in the lobby when called to triage.  

## 2014-08-11 NOTE — Progress Notes (Signed)
Pt positive for GBS in pregnancy.  Amoxicillin 500 mg TID x 7 days.  Will need prophylaxis in labor.

## 2014-08-11 NOTE — MAU Note (Signed)
Patient states she has been having low back pain since this am. Has had lower abdominal pressure that is constant. Reports thick white vaginal discharge. Reports fetal movement but not as much as usual.

## 2014-08-11 NOTE — Discharge Instructions (Signed)

## 2014-08-12 LAB — URINE CULTURE: Colony Count: 8000

## 2014-09-04 ENCOUNTER — Encounter (HOSPITAL_COMMUNITY): Payer: Self-pay | Admitting: *Deleted

## 2014-09-27 ENCOUNTER — Encounter (HOSPITAL_COMMUNITY): Payer: Self-pay | Admitting: General Practice

## 2014-09-27 ENCOUNTER — Inpatient Hospital Stay (HOSPITAL_COMMUNITY)
Admission: AD | Admit: 2014-09-27 | Discharge: 2014-09-27 | Disposition: A | Discharge status: Home / Self Care | Payer: Medicaid Other | Source: Ambulatory Visit | Attending: Obstetrics and Gynecology | Admitting: Obstetrics and Gynecology

## 2014-09-27 DIAGNOSIS — O4703 False labor before 37 completed weeks of gestation, third trimester: Secondary | ICD-10-CM | POA: Insufficient documentation

## 2014-09-27 DIAGNOSIS — Z3403 Encounter for supervision of normal first pregnancy, third trimester: Secondary | ICD-10-CM

## 2014-09-27 DIAGNOSIS — Z3A38 38 weeks gestation of pregnancy: Secondary | ICD-10-CM | POA: Diagnosis not present

## 2014-09-27 LAB — COMPREHENSIVE METABOLIC PANEL
ALT: 11 U/L (ref 0–35)
AST: 18 U/L (ref 0–37)
Albumin: 3.2 g/dL — ABNORMAL LOW (ref 3.5–5.2)
Alkaline Phosphatase: 159 U/L — ABNORMAL HIGH (ref 39–117)
Anion gap: 13 (ref 5–15)
BUN: 9 mg/dL (ref 6–23)
CO2: 19 mEq/L (ref 19–32)
Calcium: 9 mg/dL (ref 8.4–10.5)
Chloride: 102 mEq/L (ref 96–112)
Creatinine, Ser: 0.79 mg/dL (ref 0.50–1.10)
GFR calc Af Amer: >90 mL/min (ref >90)
GFR calc non Af Amer: >90 mL/min (ref >90)
Glucose, Bld: 76 mg/dL (ref 70–99)
Potassium: 3.8 mEq/L (ref 3.7–5.3)
Sodium: 134 mEq/L — ABNORMAL LOW (ref 137–147)
Total Bilirubin: 0.6 mg/dL (ref 0.3–1.2)
Total Protein: 6.7 g/dL (ref 6.0–8.3)

## 2014-09-27 LAB — CBC WITH DIFFERENTIAL/PLATELET
Basophils Absolute: 0 10*3/uL (ref 0.0–0.1)
Basophils Relative: 0 % (ref 0–1)
Eosinophils Absolute: 0.7 10*3/uL (ref 0.0–0.7)
Eosinophils Relative: 8 % — ABNORMAL HIGH (ref 0–5)
HCT: 33.8 % — ABNORMAL LOW (ref 36.0–46.0)
Hemoglobin: 11.5 g/dL — ABNORMAL LOW (ref 12.0–15.0)
Lymphocytes Relative: 12 % (ref 12–46)
Lymphs Abs: 1.2 10*3/uL (ref 0.7–4.0)
MCH: 28.2 pg (ref 26.0–34.0)
MCHC: 34 g/dL (ref 30.0–36.0)
MCV: 82.8 fL (ref 78.0–100.0)
Monocytes Absolute: 0.8 10*3/uL (ref 0.1–1.0)
Monocytes Relative: 8 % (ref 3–12)
Neutro Abs: 7 10*3/uL (ref 1.7–7.7)
Neutrophils Relative %: 72 % (ref 43–77)
Platelets: 281 10*3/uL (ref 150–400)
RBC: 4.08 MIL/uL (ref 3.87–5.11)
RDW: 13.4 % (ref 11.5–15.5)
WBC: 9.7 10*3/uL (ref 4.0–10.5)

## 2014-09-27 LAB — AMYLASE: Amylase: 82 U/L (ref 0–105)

## 2014-09-27 LAB — LIPASE, BLOOD: Lipase: 39 U/L (ref 11–59)

## 2014-09-27 NOTE — MAU Provider Note (Signed)
MAU Addendum Note  Results for orders placed or performed during the hospital encounter of 09/27/14 (from the past 24 hour(s))  CBC with Differential     Status: Abnormal   Collection Time: 09/27/14 11:07 AM  Result Value Ref Range   WBC 9.7 4.0 - 10.5 K/uL   RBC 4.08 3.87 - 5.11 MIL/uL   Hemoglobin 11.5 (L) 12.0 - 15.0 g/dL   HCT 16.133.8 (L) 09.636.0 - 04.546.0 %   MCV 82.8 78.0 - 100.0 fL   MCH 28.2 26.0 - 34.0 pg   MCHC 34.0 30.0 - 36.0 g/dL   RDW 40.913.4 81.111.5 - 91.415.5 %   Platelets 281 150 - 400 K/uL   Neutrophils Relative % 72 43 - 77 %   Neutro Abs 7.0 1.7 - 7.7 K/uL   Lymphocytes Relative 12 12 - 46 %   Lymphs Abs 1.2 0.7 - 4.0 K/uL   Monocytes Relative 8 3 - 12 %   Monocytes Absolute 0.8 0.1 - 1.0 K/uL   Eosinophils Relative 8 (H) 0 - 5 %   Eosinophils Absolute 0.7 0.0 - 0.7 K/uL   Basophils Relative 0 0 - 1 %   Basophils Absolute 0.0 0.0 - 0.1 K/uL  Comprehensive metabolic panel     Status: Abnormal   Collection Time: 09/27/14 11:07 AM  Result Value Ref Range   Sodium 134 (L) 137 - 147 mEq/L   Potassium 3.8 3.7 - 5.3 mEq/L   Chloride 102 96 - 112 mEq/L   CO2 19 19 - 32 mEq/L   Glucose, Bld 76 70 - 99 mg/dL   BUN 9 6 - 23 mg/dL   Creatinine, Ser 7.820.79 0.50 - 1.10 mg/dL   Calcium 9.0 8.4 - 95.610.5 mg/dL   Total Protein 6.7 6.0 - 8.3 g/dL   Albumin 3.2 (L) 3.5 - 5.2 g/dL   AST 18 0 - 37 U/L   ALT 11 0 - 35 U/L   Alkaline Phosphatase 159 (H) 39 - 117 U/L   Total Bilirubin 0.6 0.3 - 1.2 mg/dL   GFR calc non Af Amer >90 >90 mL/min   GFR calc Af Amer >90 >90 mL/min   Anion gap 13 5 - 15     Plan: -Discussed need to follow up in office for next ROB  -Bleeding and labor Precautions -Encouraged to call if any questions or concerns arise prior to next scheduled office visit.  -Discharged to home in stable condition Will call in abnormal labs   Safir Michalec, CNM, MSN 09/27/2014. 12:45 PM

## 2014-09-27 NOTE — MAU Note (Signed)
Patient states she has been having pain for about 4 days. States she had an intense pain this am but the pain went away on the way to the hospital. Denies bleeding or leaking and reports good fetal movement.

## 2014-09-27 NOTE — Discharge Instructions (Signed)

## 2014-09-27 NOTE — MAU Note (Signed)
Urine in lab 

## 2014-09-27 NOTE — MAU Provider Note (Signed)
Cheryl LoganCheryl Logan is a 18 y.o. G1P0 at 38.4 weeks present to MAU c/o painful ctx that lasted an hour but went away.  She also c/o severe itching all over. She states she never c/o the ROB bc she didn't think it was pregnancy related.  She denies vb or lof w/+FM   History     Patient Active Problem List   Diagnosis Date Noted  . GBS (group B streptococcus) UTI complicating pregnancy--07/16/14 08/07/2014  . Short cervix 07/18/2014  . Pyelonephritis complicating pregnancy, antepartum 07/17/2014    Chief Complaint  Patient presents with  . Back Pain  . Pelvic Pain   HPI  OB History    Gravida Para Term Preterm AB TAB SAB Ectopic Multiple Living   1               Past Medical History  Diagnosis Date  . Eczema   . Asthma   . Infection     UTI  . Pyelonephritis complicating pregnancy, antepartum 07/17/2014    Past Surgical History  Procedure Laterality Date  . No past surgeries      History reviewed. No pertinent family history.  History  Substance Use Topics  . Smoking status: Never Smoker   . Smokeless tobacco: Not on file  . Alcohol Use: No    Allergies:  Allergies  Allergen Reactions  . Shellfish Allergy Anaphylaxis  . Peanut-Containing Drug Products Swelling    Prescriptions prior to admission  Medication Sig Dispense Refill Last Dose  . albuterol (PROVENTIL HFA;VENTOLIN HFA) 108 (90 BASE) MCG/ACT inhaler Inhale 2 puffs into the lungs every 6 (six) hours as needed for wheezing or shortness of breath.   Past Week at Unknown time  . amoxicillin (AMOXIL) 500 MG capsule Take 1 capsule (500 mg total) by mouth 3 (three) times daily. 21 capsule 0 09/26/2014 at Unknown time  . clindamycin-benzoyl peroxide (BENZACLIN) gel Apply 1 application topically daily.    09/26/2014 at Unknown time  . Prenatal Vit-Fe Fumarate-FA (PRENATAL MULTIVITAMIN) TABS tablet Take 1 tablet by mouth daily.   09/26/2014 at Unknown time  . zolpidem (AMBIEN CR) 12.5 MG CR tablet Take 1 tablet  (12.5 mg total) by mouth at bedtime as needed for sleep. 30 tablet 1 prn    ROS See HPI above, all other systems are negative  Physical Exam   Blood pressure 123/78, pulse 98, temperature 97.7 F (36.5 C), temperature source Oral, resp. rate 18, height 5\' 3"  (1.6 m), weight 155 lb 3.2 oz (70.398 kg), last menstrual period 12/04/2013.  Physical Exam Ext:  WNL ABD: Soft, non tender to palpation, no rebound or guarding SVE: 1/T/H   ED Course  Assessment: IUP at  38.4 weeks Membranes: intact FHR: Category 1 CTX:  occassional   Plan: Labs: CMP, Amalyse, lipase, bile acid salts, alt, CBC   Terisha Losasso, CNM, MSN 09/27/2014. 10:10 AM

## 2014-09-27 NOTE — MAU Note (Signed)
Pt presents to mau with c/o sudden extreme pain in her pelvis and lower back that lasted about 1 hour then suddenly went away. Pt states the pain was extreme and it was concerning to her.

## 2014-09-28 LAB — BILE ACIDS, TOTAL: Bile Acids Total: 8 umol/L (ref 0–19)

## 2014-10-08 ENCOUNTER — Inpatient Hospital Stay (HOSPITAL_COMMUNITY)
Admission: AD | Admit: 2014-10-08 | Discharge: 2014-10-08 | Disposition: A | Discharge status: Home / Self Care | Payer: Medicaid Other | Source: Ambulatory Visit | Attending: Obstetrics & Gynecology | Admitting: Obstetrics & Gynecology

## 2014-10-08 ENCOUNTER — Encounter (HOSPITAL_COMMUNITY): Payer: Self-pay | Admitting: *Deleted

## 2014-10-08 DIAGNOSIS — Z3A40 40 weeks gestation of pregnancy: Secondary | ICD-10-CM | POA: Insufficient documentation

## 2014-10-08 DIAGNOSIS — O99820 Streptococcus B carrier state complicating pregnancy: Secondary | ICD-10-CM | POA: Diagnosis not present

## 2014-10-08 DIAGNOSIS — O368130 Decreased fetal movements, third trimester, not applicable or unspecified: Secondary | ICD-10-CM | POA: Diagnosis present

## 2014-10-08 DIAGNOSIS — O26873 Cervical shortening, third trimester: Secondary | ICD-10-CM | POA: Diagnosis not present

## 2014-10-08 DIAGNOSIS — O471 False labor at or after 37 completed weeks of gestation: Secondary | ICD-10-CM | POA: Insufficient documentation

## 2014-10-08 NOTE — MAU Provider Note (Signed)
History    [name] Cindra Presumeorrence is a 18 y.o. G1P0 at 40.1wks who presents, after phone call, for decreased fetal movement.  Patient states that she had not detected movement since 12am and attempted sugar, hydration, and relaxation.  Patient denies LOF, VB, and ctx, but reports some intermittent pelvic pain.  Patient admits that upon arrival she did detect fetal movement.   Patient Active Problem List   Diagnosis Date Noted  . GBS (group B streptococcus) UTI complicating pregnancy--07/16/14 08/07/2014  . Short cervix 07/18/2014  . Pyelonephritis complicating pregnancy, antepartum 07/17/2014    Chief Complaint  Patient presents with  . Decreased Fetal Movement   HPI  OB History    Gravida Para Term Preterm AB TAB SAB Ectopic Multiple Living   1               Past Medical History  Diagnosis Date  . Eczema   . Asthma   . Infection     UTI  . Pyelonephritis complicating pregnancy, antepartum 07/17/2014    Past Surgical History  Procedure Laterality Date  . No past surgeries      History reviewed. No pertinent family history.  History  Substance Use Topics  . Smoking status: Never Smoker   . Smokeless tobacco: Not on file  . Alcohol Use: No    Allergies:  Allergies  Allergen Reactions  . Shellfish Allergy Anaphylaxis  . Peanut-Containing Drug Products Swelling    Prescriptions prior to admission  Medication Sig Dispense Refill Last Dose  . albuterol (PROVENTIL HFA;VENTOLIN HFA) 108 (90 BASE) MCG/ACT inhaler Inhale 2 puffs into the lungs every 6 (six) hours as needed for wheezing or shortness of breath.   Past Week at Unknown time  . amoxicillin (AMOXIL) 500 MG capsule Take 1 capsule (500 mg total) by mouth 3 (three) times daily. 21 capsule 0 09/26/2014 at Unknown time  . clindamycin-benzoyl peroxide (BENZACLIN) gel Apply 1 application topically daily.    09/26/2014 at Unknown time  . Prenatal Vit-Fe Fumarate-FA (PRENATAL MULTIVITAMIN) TABS tablet Take 1 tablet by mouth  daily.   09/26/2014 at Unknown time  . zolpidem (AMBIEN CR) 12.5 MG CR tablet Take 1 tablet (12.5 mg total) by mouth at bedtime as needed for sleep. 30 tablet 1 prn    ROS  See HPI Above Physical Exam   Blood pressure 132/65, pulse 88, temperature 97.3 F (36.3 C), resp. rate 18, last menstrual period 12/04/2013.  Physical Exam  Constitutional: She is oriented to person, place, and time. She appears well-developed and well-nourished. No distress.  Cardiovascular: Normal rate, regular rhythm and normal heart sounds.   GI: Soft. Bowel sounds are normal.  Musculoskeletal: Normal range of motion.  Neurological: She is alert and oriented to person, place, and time.  Skin: Skin is warm and dry.  FHR: 145 bpm, Mod Var, -Decels, +Accels UC: Q 1-53min, palpates mild to moderate  SVE: 2/50/-3/ Medium/Posterior/No Bloody Show ED Course  Assessment: IUP at 40.1wks Cat I FT Decreased FM Contractions  Plan: -SVE as above -Patient given option to ambulate or go home after reactive NST -Educated regarding GBS status  Follow Up (0330) -Reactive NST -Patient opts to go home -Educated regarding early labor, LOF, VB -Questions and concerns addressed, reassurances given -Keep appt as scheduled: 10/10/2014 -Encouraged to call if any questions or concerns arise prior to next scheduled office visit.  -Discharged to home in early labor  [facility], Bentlie Withem LYNN CNM, MSN 10/08/2014 2:33 AM

## 2014-10-08 NOTE — MAU Note (Signed)
Pt reports she has not felt baby move since about 9 pm tonight. Pt denies SROM or bleeding. reports some yellow mucusy discharge. Denies pain or ctx at this time

## 2014-10-08 NOTE — Discharge Instructions (Signed)
Group B Streptococcus Infection During Pregnancy Group B streptococcus (GBS) is a type of bacteria often found in healthy women. GBS is not the same as the bacteria that causes strep throat. You may have GBS in your vagina, rectum, or bladder. GBS does not spread through sexual contact, but it can be passed to a baby during childbirth. This can be dangerous for your baby. It is not dangerous to you and usually does not cause any symptoms. Your health care provider may test you for GBS when your pregnancy is between 35 and 37 weeks. GBS is dangerous only during birth, so there is no need to test for it earlier. It is possible to have GBS during pregnancy and never pass it to your baby. If your test results are positive for GBS, your health care provider may recommend giving you antibiotic medicine during delivery to make sure your baby stays healthy. RISK FACTORS You are more likely to pass GBS to your baby if:   Your water breaks (ruptured membrane) or you go into labor before 37 weeks.  Your water breaks 18 hours before you deliver.  You passed GBS during a previous pregnancy.  You have a urinary tract infection caused by GBS any time during pregnancy.  You have a fever during labor. SYMPTOMS Most women who have GBS do not have any symptoms. If you have a urinary tract infection caused by GBS, you might have frequent or painful urination and fever. Babies who get GBS usually show symptoms within 7 days of birth. Symptoms may include:   Breathing problems.  Heart and blood pressure problems.  Digestive and kidney problems. DIAGNOSIS Routine screening for GBS is recommended for all pregnant women. A health care provider takes a sample of the fluid in your vagina and rectum with a swab. It is then sent to a lab to be checked for GBS. A sample of your urine may also be checked for the bacteria.  TREATMENT If you test positive for GBS, you may need treatment with an antibiotic medicine during  labor. As soon as you go into labor, or as soon as your membranes rupture, you will get the antibiotic medicine through an IV access. You will continue to get the medicine until after you give birth. You do not need antibiotic medicine if you are having a cesarean delivery.If your baby shows signs or symptoms of GBS after birth, your baby can also be treated with an antibiotic medicine. HOME CARE INSTRUCTIONS   Take all antibiotic medicine as prescribed by your health care provider. Only take medicine as directed.   Continue with prenatal visits and care.   Keep all follow-up appointments.  SEEK MEDICAL CARE IF:   You have pain when you urinate.   You have to urinate frequently.   You have a fever.  SEEK IMMEDIATE MEDICAL CARE IF:   Your membranes rupture.  You go into labor. Document Released: 01/27/2008 Document Revised: 10/25/2013 Document Reviewed: 08/12/2013 [facility] Patient Information 2015 [place], [place]. This information is not intended to replace advice given to you by your health care provider. Make sure you discuss any questions you have with your health care provider. Third Trimester of Pregnancy The third trimester is from week 29 through week 42, months 7 through 9. This trimester is when your unborn baby (fetus) is growing very fast. At the end of the ninth month, the unborn baby is about 20 inches in length. It weighs about 6-10 pounds.  HOME CARE   Avoid  all smoking, herbs, and alcohol. Avoid drugs not approved by your doctor.  Only take medicine as told by your doctor. Some medicines are safe and some are not during pregnancy.  Exercise only as told by your doctor. Stop exercising if you start having cramps.  Eat regular, healthy meals.  Wear a good support bra if your breasts are tender.  Do not use hot tubs, steam rooms, or saunas.  Wear your seat belt when driving.  Avoid raw meat, uncooked cheese, and liter boxes and soil used by cats.  Take  your prenatal vitamins.  Try taking medicine that helps you poop (stool softener) as needed, and if your doctor approves. Eat more fiber by eating fresh fruit, vegetables, and whole grains. Drink enough fluids to keep your pee (urine) clear or pale yellow.  Take warm water baths (sitz baths) to soothe pain or discomfort caused by hemorrhoids. Use hemorrhoid cream if your doctor approves.  If you have puffy, bulging veins (varicose veins), wear support hose. Raise (elevate) your feet for 15 minutes, 3-4 times a day. Limit salt in your diet.  Avoid heavy lifting, wear low heels, and sit up straight.  Rest with your legs raised if you have leg cramps or low back pain.  Visit your dentist if you have not gone during your pregnancy. Use a soft toothbrush to brush your teeth. Be gentle when you floss.  You can have sex (intercourse) unless your doctor tells you not to.  Do not travel far distances unless you must. Only do so with your doctor's approval.  Take prenatal classes.  Practice driving to the hospital.  Pack your hospital bag.  Prepare the baby's room.  Go to your doctor visits. GET HELP IF:  You are not sure if you are in labor or if your water has broken.  You are dizzy.  You have mild cramps or pressure in your lower belly (abdominal).  You have a nagging pain in your belly area.  You continue to feel sick to your stomach (nauseous), throw up (vomit), or have watery poop (diarrhea).  You have bad smelling fluid coming from your vagina.  You have pain with peeing (urination). GET HELP RIGHT AWAY IF:   You have a fever.  You are leaking fluid from your vagina.  You are spotting or bleeding from your vagina.  You have severe belly cramping or pain.  You lose or gain weight rapidly.  You have trouble catching your breath and have chest pain.  You notice sudden or extreme puffiness (swelling) of your face, hands, ankles, feet, or legs.  You have not felt the  baby move in over an hour.  You have severe headaches that do not go away with medicine.  You have vision changes. Document Released: 01/14/2010 Document Revised: 02/14/2013 Document Reviewed: 12/21/2012 [facility] Patient Information 2015 [place], [place]. This information is not intended to replace advice given to you by your health care provider. Make sure you discuss any questions you have with your health care provider.

## 2014-10-09 ENCOUNTER — Encounter (HOSPITAL_COMMUNITY): Payer: Self-pay | Admitting: *Deleted

## 2014-10-09 ENCOUNTER — Inpatient Hospital Stay (HOSPITAL_COMMUNITY)
Admission: AD | Admit: 2014-10-09 | Discharge: 2014-10-14 | DRG: 765 | Disposition: A | Discharge status: Home / Self Care | Payer: Medicaid Other | Source: Ambulatory Visit | Attending: Obstetrics and Gynecology | Admitting: Obstetrics and Gynecology

## 2014-10-09 DIAGNOSIS — O9902 Anemia complicating childbirth: Secondary | ICD-10-CM | POA: Diagnosis present

## 2014-10-09 DIAGNOSIS — O99824 Streptococcus B carrier state complicating childbirth: Secondary | ICD-10-CM | POA: Diagnosis present

## 2014-10-09 DIAGNOSIS — Z8249 Family history of ischemic heart disease and other diseases of the circulatory system: Secondary | ICD-10-CM

## 2014-10-09 DIAGNOSIS — J45909 Unspecified asthma, uncomplicated: Secondary | ICD-10-CM | POA: Diagnosis not present

## 2014-10-09 DIAGNOSIS — R140 Abdominal distension (gaseous): Secondary | ICD-10-CM | POA: Diagnosis not present

## 2014-10-09 DIAGNOSIS — Z98891 History of uterine scar from previous surgery: Secondary | ICD-10-CM

## 2014-10-09 DIAGNOSIS — L309 Dermatitis, unspecified: Secondary | ICD-10-CM | POA: Diagnosis present

## 2014-10-09 DIAGNOSIS — Z833 Family history of diabetes mellitus: Secondary | ICD-10-CM | POA: Diagnosis not present

## 2014-10-09 DIAGNOSIS — O9089 Other complications of the puerperium, not elsewhere classified: Secondary | ICD-10-CM | POA: Diagnosis not present

## 2014-10-09 DIAGNOSIS — N852 Hypertrophy of uterus: Secondary | ICD-10-CM | POA: Diagnosis not present

## 2014-10-09 DIAGNOSIS — Z91018 Allergy to other foods: Secondary | ICD-10-CM | POA: Diagnosis present

## 2014-10-09 DIAGNOSIS — O9952 Diseases of the respiratory system complicating childbirth: Secondary | ICD-10-CM | POA: Diagnosis present

## 2014-10-09 DIAGNOSIS — O99613 Diseases of the digestive system complicating pregnancy, third trimester: Secondary | ICD-10-CM | POA: Diagnosis present

## 2014-10-09 DIAGNOSIS — O26873 Cervical shortening, third trimester: Secondary | ICD-10-CM | POA: Diagnosis present

## 2014-10-09 DIAGNOSIS — K219 Gastro-esophageal reflux disease without esophagitis: Secondary | ICD-10-CM | POA: Diagnosis present

## 2014-10-09 DIAGNOSIS — R52 Pain, unspecified: Secondary | ICD-10-CM

## 2014-10-09 DIAGNOSIS — D649 Anemia, unspecified: Secondary | ICD-10-CM | POA: Diagnosis present

## 2014-10-09 DIAGNOSIS — K469 Unspecified abdominal hernia without obstruction or gangrene: Secondary | ICD-10-CM

## 2014-10-09 DIAGNOSIS — A6000 Herpesviral infection of urogenital system, unspecified: Secondary | ICD-10-CM | POA: Diagnosis not present

## 2014-10-09 DIAGNOSIS — O9832 Other infections with a predominantly sexual mode of transmission complicating childbirth: Secondary | ICD-10-CM | POA: Diagnosis present

## 2014-10-09 DIAGNOSIS — Z3A40 40 weeks gestation of pregnancy: Secondary | ICD-10-CM | POA: Diagnosis present

## 2014-10-09 HISTORY — DX: Unspecified asthma, uncomplicated: J45.909

## 2014-10-09 HISTORY — DX: Herpesviral infection, unspecified: B00.9

## 2014-10-09 HISTORY — DX: Gastro-esophageal reflux disease without esophagitis: K21.9

## 2014-10-09 HISTORY — DX: Chlamydial infection, unspecified: A74.9

## 2014-10-09 LAB — CBC
HCT: 35.2 % — ABNORMAL LOW (ref 36.0–46.0)
Hemoglobin: 11.8 g/dL — ABNORMAL LOW (ref 12.0–15.0)
MCH: 27.4 pg (ref 26.0–34.0)
MCHC: 33.5 g/dL (ref 30.0–36.0)
MCV: 81.9 fL (ref 78.0–100.0)
Platelets: 314 10*3/uL (ref 150–400)
RBC: 4.3 MIL/uL (ref 3.87–5.11)
RDW: 13.5 % (ref 11.5–15.5)
WBC: 11.5 10*3/uL — ABNORMAL HIGH (ref 4.0–10.5)

## 2014-10-09 LAB — HIV ANTIBODY (ROUTINE TESTING W REFLEX): HIV 1&2 Ab, 4th Generation: NONREACTIVE

## 2014-10-09 LAB — TYPE AND SCREEN
ABO/RH(D): B POS
Antibody Screen: NEGATIVE

## 2014-10-09 LAB — RPR

## 2014-10-09 LAB — ABO/RH: ABO/RH(D): B POS

## 2014-10-09 MED ORDER — PENICILLIN G POTASSIUM 5000000 UNITS IJ SOLR
2.5000 10*6.[IU] | INTRAVENOUS | Status: DC
  Administered 2014-10-09 – 2014-10-10 (×3): 2.5 10*6.[IU] via INTRAVENOUS
  Filled 2014-10-09 (×8): qty 2.5

## 2014-10-09 MED ORDER — OXYTOCIN BOLUS FROM INFUSION: 500.0000 mL | INTRAVENOUS | Status: DC

## 2014-10-09 MED ORDER — OXYTOCIN 40 UNITS IN LACTATED RINGERS INFUSION - SIMPLE MED
1.0000 m[IU]/min | INTRAVENOUS | Status: DC
  Administered 2014-10-09: 1 m[IU]/min via INTRAVENOUS
  Filled 2014-10-09: qty 1000

## 2014-10-09 MED ORDER — OXYCODONE-ACETAMINOPHEN 5-325 MG PO TABS: 2.0000 | ORAL_TABLET | ORAL | Status: DC | PRN

## 2014-10-09 MED ORDER — LACTATED RINGERS IV SOLN
500.0000 mL | INTRAVENOUS | Status: DC | PRN
  Administered 2014-10-10 (×2): 500 mL via INTRAVENOUS

## 2014-10-09 MED ORDER — FENTANYL 2.5 MCG/ML BUPIVACAINE 1/10 % EPIDURAL INFUSION (WH - ANES)
14.0000 mL/h | INTRAMUSCULAR | Status: DC | PRN
  Administered 2014-10-10: 14 mL/h via EPIDURAL
  Filled 2014-10-09: qty 125

## 2014-10-09 MED ORDER — DIPHENHYDRAMINE HCL 50 MG/ML IJ SOLN: 12.5000 mg | INTRAMUSCULAR | Status: DC | PRN

## 2014-10-09 MED ORDER — LIDOCAINE HCL (PF) 1 % IJ SOLN: 30.0000 mL | INTRAMUSCULAR | Status: DC | PRN

## 2014-10-09 MED ORDER — EPHEDRINE 5 MG/ML INJ: 10.0000 mg | INTRAVENOUS | Status: DC | PRN

## 2014-10-09 MED ORDER — LACTATED RINGERS IV SOLN
500.0000 mL | Freq: Once | INTRAVENOUS | Status: AC
  Administered 2014-10-10: 500 mL via INTRAVENOUS

## 2014-10-09 MED ORDER — ZOLPIDEM TARTRATE 5 MG PO TABS: 5.0000 mg | ORAL_TABLET | Freq: Every evening | ORAL | Status: DC | PRN

## 2014-10-09 MED ORDER — ACETAMINOPHEN 325 MG PO TABS: 650.0000 mg | ORAL_TABLET | ORAL | Status: DC | PRN

## 2014-10-09 MED ORDER — PHENYLEPHRINE 40 MCG/ML (10ML) SYRINGE FOR IV PUSH (FOR BLOOD PRESSURE SUPPORT)
80.0000 ug | PREFILLED_SYRINGE | INTRAVENOUS | Status: DC | PRN
  Filled 2014-10-09: qty 10

## 2014-10-09 MED ORDER — LACTATED RINGERS IV SOLN
INTRAVENOUS | Status: DC
Start: 2014-10-09 — End: 2014-10-09

## 2014-10-09 MED ORDER — OXYTOCIN 40 UNITS IN LACTATED RINGERS INFUSION - SIMPLE MED: 62.5000 mL/h | INTRAVENOUS | Status: DC

## 2014-10-09 MED ORDER — PENICILLIN G POTASSIUM 5000000 UNITS IJ SOLR
5.0000 10*6.[IU] | Freq: Once | INTRAVENOUS | Status: AC
  Administered 2014-10-09: 5 10*6.[IU] via INTRAVENOUS
  Filled 2014-10-09: qty 5

## 2014-10-09 MED ORDER — BUTORPHANOL TARTRATE 1 MG/ML IJ SOLN
1.0000 mg | INTRAMUSCULAR | Status: DC | PRN
  Administered 2014-10-09 (×2): 1 mg via INTRAVENOUS
  Filled 2014-10-09 (×2): qty 1

## 2014-10-09 MED ORDER — CITRIC ACID-SODIUM CITRATE 334-500 MG/5ML PO SOLN
30.0000 mL | ORAL | Status: DC | PRN
  Administered 2014-10-10: 30 mL via ORAL
  Filled 2014-10-09 (×2): qty 15

## 2014-10-09 MED ORDER — FLEET ENEMA 7-19 GM/118ML RE ENEM: 1.0000 | ENEMA | RECTAL | Status: DC | PRN

## 2014-10-09 MED ORDER — ONDANSETRON HCL 4 MG/2ML IJ SOLN: 4.0000 mg | Freq: Four times a day (QID) | INTRAMUSCULAR | Status: DC | PRN

## 2014-10-09 MED ORDER — LACTATED RINGERS IV SOLN
INTRAVENOUS | Status: DC
  Administered 2014-10-09 – 2014-10-10 (×3): via INTRAVENOUS

## 2014-10-09 MED ORDER — TERBUTALINE SULFATE 1 MG/ML IJ SOLN
0.2500 mg | Freq: Once | INTRAMUSCULAR | Status: AC | PRN
  Administered 2014-10-10: 0.25 mg via SUBCUTANEOUS

## 2014-10-09 MED ORDER — OXYCODONE-ACETAMINOPHEN 5-325 MG PO TABS: 1.0000 | ORAL_TABLET | ORAL | Status: DC | PRN

## 2014-10-09 MED ORDER — PHENYLEPHRINE 40 MCG/ML (10ML) SYRINGE FOR IV PUSH (FOR BLOOD PRESSURE SUPPORT): 80.0000 ug | PREFILLED_SYRINGE | INTRAVENOUS | Status: DC | PRN

## 2014-10-09 NOTE — Progress Notes (Signed)
In to discuss AROM since no cervical change since 5:00 PM today. Pt tearful, stating she is "afraid of the unknown" and does not wish to have amniotomy at this time, yet wants to continue with IV pain medication and allow things to happen naturally.  Explained to pt that she is not in labor despite Pitocin and hours of waiting for spontaneous labor to occur. Pt has had 2 doses of Stadol, which she states has not provided much relief, however she continues to request it. I informed her that she could have an epidural prior to AROM if she desired.  FHRT reassuring, but there are occasional variables that resolve w/ intrauterine resuscitative measures.   She has a great support system. I informed her that I would give her adequate time to discuss AROM and IUPC with her family. Will discuss w/ Dr. Su Hiltoberts if pt continues to refuse amniotomy/active management of labor.   A: IUP at 40.2 wks Latent labor GBS positive (adequately treated)  P: Allow pt time to decide on AROM and IUPC. Monitor FHRT closely. Epidural as desired.     Sherre ScarletKimberly Zoiee Wimmer, CNM 10/09/14, 11:16 PM

## 2014-10-09 NOTE — Progress Notes (Signed)
  Subjective: Now on Birthing Suite--having back labor, requesting pain med.  Objective: BP 129/71 mmHg  Pulse 98  Temp(Src) 97.3 F (36.3 C) (Oral)  Resp 20  Ht 5\' 3"  (1.6 m)  Wt 165 lb (74.844 kg)  BMI 29.24 kg/m2  LMP 12/04/2013      FHT: Category 1 UC:   regular, every 3 minutes SVE:   Deferred  Assessment:  Early labor GBS positive  Plan: Pain med/epidural prn. Continue labor observation and support.  Nigel BridgemanLATHAM, Fintan Grater CNM 10/09/2014, 12:39 PM

## 2014-10-09 NOTE — Progress Notes (Signed)
  Subjective: Pt elects to proceed w/ amniotomy and placement of IUPC.  Objective: BP 129/54 mmHg  Pulse 88  Temp(Src) 98.7 F (37.1 C) (Oral)  Resp 20  Ht 5\' 3"  (1.6 m)  Wt 165 lb (74.844 kg)  BMI 29.24 kg/m2  LMP 12/04/2013     FHT: BL 150 w/ mod variability, some sharp variables to 125 bpm, less than 15 sec, w/ gradual return to baseline w/ intrauterine resuscitative measures UC:   irregular, every 2-3 minutes SVE: 3-4/100/-2/-1    Pitocin at 4 mius/min AROM'd, light mec IUPC placed w/o difficulty  Assessment:  IUP at 40.2 wks Cat 1 FHRT GBS positive Inadequate MVUs  Plan: Continue w/ current plan Amnioinfusion as indicated Consult prn Anticipate progress and SVD  Sherre ScarletWILLIAMS, Lynne Takemoto CNM 10/09/2014, 11:48 PM

## 2014-10-09 NOTE — Progress Notes (Signed)
  Subjective: Still having coupling/tripling, back labor.   Objective: BP 116/61 mmHg  Pulse 97  Temp(Src) 98.2 F (36.8 C) (Oral)  Resp 20  Ht 5\' 3"  (1.6 m)  Wt 165 lb (74.844 kg)  BMI 29.24 kg/m2  LMP 12/04/2013      FHT: Category 1 UC:   irregular, every 3-6 minutes SVE: Deferred at present Vtx to bedside US--baby OP  Assessment:  Prolonged latent phase/early labor  Plan: Reviewed status of prolonged latent phase/early labor with patient and family. Options for continued observation, d/c home, or pitocin augmentation.  I recommend pitocin augmentation or d/c home.   Since patient remains very uncomfortable with contractions, although pattern is dysfunctional, I offered augmentation with discussion of R&B. Vtx too high for AROM at present. Patient agreeable to augmentation. Will start low-dose pitocin Pain med/epidural prn. Discussed recommendation for epidural if multiple IV doses of pain med are required.   Nigel BridgemanLATHAM, Jasir Rother CNM 10/09/2014, 6:27 PM

## 2014-10-09 NOTE — Progress Notes (Signed)
  Subjective: Assumed care of this 18 yo G1P0 @ 40.2 wks who was admitted in early labor. Complained of excruciating back pain on admission, relieved with 1 mg Stadol at 12:35 pm. In to meet pt - lying on side with eyes closed. States aware of plan; no questions at this time. Family at bedside.  Objective: BP 108/47 mmHg  Pulse 76  Temp(Src) 98.7 F (37.1 C) (Oral)  Resp 20  Ht 5\' 3"  (1.6 m)  Wt 165 lb (74.844 kg)  BMI 29.24 kg/m2  LMP 12/04/2013     FHT: BL 140 w/ moderate variability, +accels, no decels UC:   Irregular SVE: Deferred    Pitocin now at 2 mius/min - started at 18:50 pm Has received 2 doses of PCN  Assessment:  IUP at 40.2 wks Early labor GBS positive Cat 1 FHRT  Plan: Continue w/ current plan Re-evaluate in a couple of hrs for possible AROM Consult as indicated Anticipate progress and SVD   Sherre ScarletWILLIAMS, Azizi Bally CNM 10/09/2014, 7:43 PM

## 2014-10-09 NOTE — H&P (Signed)
Cheryl Logan is a 18 y.o. female, G1P0 at 640 2/7 weeks, presenting for UCs q 4-5 min x several hours.  Denies leaking or bleeding, reports +FM.  Seen at MAU 12/6 for decreased FM and intermittent pelvic pain, with cervix 2 cm, 50%, vtx, -3, posterior.  FM began to be more noticeable upon patient arrival to MAU, and she had a Category 1 tracing during her evaluation.  She was unaware of contractions, although they traced at q 1-3 min.  Denies HSV lesions or prodrome.  Has been on Valtrex.  Patient Active Problem List   Diagnosis Date Noted  . Normal labor 10/09/2014  . 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 Multiple food allergies--banana, fish oil, peanut 10/09/2014  . Asthma, chronic 10/09/2014  . Eczema 10/09/2014  . GBS (group B streptococcus) UTI complicating pregnancy--07/16/14 08/07/2014  . Short cervix 07/18/2014  . Pyelonephritis complicating pregnancy, antepartum 07/17/2014    History of present pregnancy: Patient [place] care [place] 26 3/7 weeks [place]  Conceived on Depo per patient report. EDC of 10/06/14 was established by LMP, and in agreement with US at 8 1/7 weeks.   Anatomy scan:  Approx 20 weeks, with normal findings.  Additional US evaluations:  28 3/7 weeks:  Cervix 2.8 with funneling.  Normal fluid, BPP 8/8, transverse, with head to maternal left.   29 5/7 weeks:  Cervix 4.2, no funneling,   Significant prenatal events:  [place]  GBS noted in urine. Cervix noted to be short with funneling at 28 weeks, withHad betamethasone course.  Also admitted 9/13-9/15  for pyelonephritis Had been treated and cleared from + chlamydia in June.  Had +FFN at 32 weeks.Had persistent vulvar irritation, with HSV 1 and 2 titers done at 33 weeks, then on Valtrex since 34 weeks.  Had overall itching noted at 37 weeks, likely PUPPs per Dr. Sallye OberKulwa.  Did have bile salts and CMP done 11/25, with negative findings.  Noted to have hx of eczema.  Declined  flu and TDAP during pregnancy.  Frequent calls over the last week regarding questions, and MAU evaluation 10/08/14. Last evaluation:  10/08/14 in MAU--cervix 2 cm, posterior, vtx, -3.  OB History    Gravida Para Term Preterm AB TAB SAB Ectopic Multiple Living   1              Past Medical History  Diagnosis Date  . Eczema   . Pyelonephritis complicating pregnancy, antepartum 07/17/2014  . Arthritis    Past Surgical History  Procedure Laterality Date  . No past surgeries     Family History: MFM HTN, aneurysm, thyroid dx; MFM Diabetes, hx etoh.  Social History:  reports that she has never smoked. She does not have any smokeless tobacco history on file. She reports that she does not drink alcohol or use illicit drugs.  Patient is single, African American, some college, currently unemployed, of the [place].  FOB is present, along with mother and another family member.   Prenatal Transfer Tool  Maternal Diabetes: No Genetic Screening: Normal Maternal Ultrasounds/Referrals: Normal Fetal Ultrasounds or other Referrals:  None Maternal Substance Abuse:  No Significant Maternal Medications:  None Significant Maternal Lab Results: Lab values include: Group B Strep positive   DECLINED FLU AND TDAP      ROS:  Contractions, +FM  Allergies  Allergen Reactions  . Shellfish Allergy Anaphylaxis  . Peanut-Containing Drug Products Swelling     Dilation: 2.5 Effacement (%): 70  Station: -2 Exam by:: Cheryl Logan CNM Blood pressure 129/71, pulse 98, temperature 97.3 F (36.3 C), temperature source Oral, resp. rate 20, height 5\' 3"  (1.6 m), weight 165 lb (74.844 kg), last menstrual period 12/04/2013.   Cervix on recheck after ambulation:  3 cm, 70%, vtx, -2, BBOW.  Chest clear Heart RRR without murmur Abd gravid, NT, FH 39 cm Pelvic: as above.  Membranes bulging, small amount bloody show.  No HSV lesions or prodrome. Ext: WNL  FHR: Category 1 UCs:  q 3 min, moderate, some  coupling.  Prenatal labs: ABO, Rh:  B+ Antibody:  Neg Rubella:   Immune RPR:   NR HBsAg:   Neg HIV:   NR GBS: Positive (08/27 0000)Positive 07/16/14 Sickle cell/Hgb electrophoresis:  AA Pap:  Deferred due to age GC: Negative 03/02/14, negative 04/2014, negative 08/09/14 Chlamydia:  Negative 03/02/14, positive 04/2014, negative 04/2014, negative 08/09/14 Genetic screenings:  Negative Quad screen Glucola:  WNL Other:   FFN positive 08/09/14 Hgb 13.4 at NOB, 11.4 at 28 weeks. TSH 0.973 03/28/14 Vit D WNL 03/28/14 HSV 2 positive 08/21/14, HSV 1 negative 08/21/14 Urine culture +Klebsiella 06/26/14, + GBS 07/16/14, negative 08/07/14. CMP and bile acids WNL 09/27/14.    Assessment/Plan: IUP at 40 2/7 weeks Early labor GBS positive Hx chlamydia 04/2014, negative TOC Hx cervical shortening, received betamethasone. 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 Pain med/epidural prn--patient initially hopes to avoid pain med.  Cheryl Logan, Cheryl Logan, CNM, [place] 10/09/2014, 11:30a

## 2014-10-09 NOTE — MAU Note (Signed)
contractions q 4-5.  No bleeding or leaking.

## 2014-10-09 NOTE — Progress Notes (Signed)
  Subjective: Slept at intervals, feeling more pain now.  Received Stadol at 1235 with benefit.   Objective: BP 116/61 mmHg  Pulse 97  Temp(Src) 98.2 F (36.8 C) (Oral)  Resp 20  Ht 5\' 3"  (1.6 m)  Wt 165 lb (74.844 kg)  BMI 29.24 kg/m2  LMP 12/04/2013      FHT: Category 1 UC:   irregular, every 2-6 minutes, tripling noted. SVE:   Dilation: 3 Effacement (%): 100 Station: -2 Exam by:: v Emilee HeroLatham, CNM  BBOW, vtx not well-applied   Assessment:  Early labor Dysfunctional labor pattern GBS positive  Plan: Reviewed status of labor with patient and family. I recommend pitocin to facilitate efficiency of labor status and epidural if patient desires more effective pain management. Patient "not ready for that yet", but considering. Will reassess in 2 hours or prn.  Nigel BridgemanLATHAM, Angelica Wix CNM 10/09/2014, 5:01 PM

## 2014-10-10 ENCOUNTER — Inpatient Hospital Stay (HOSPITAL_COMMUNITY): Payer: Medicaid Other | Admitting: Anesthesiology

## 2014-10-10 ENCOUNTER — Encounter (HOSPITAL_COMMUNITY): Payer: Self-pay | Admitting: Anesthesiology

## 2014-10-10 ENCOUNTER — Encounter (HOSPITAL_COMMUNITY)
Admission: AD | Disposition: A | Discharge status: Home / Self Care | Payer: Self-pay | Source: Ambulatory Visit | Attending: Obstetrics and Gynecology

## 2014-10-10 DIAGNOSIS — O26873 Cervical shortening, third trimester: Secondary | ICD-10-CM

## 2014-10-10 DIAGNOSIS — O9832 Other infections with a predominantly sexual mode of transmission complicating childbirth: Secondary | ICD-10-CM

## 2014-10-10 SURGERY — Surgical Case
Anesthesia: Epidural | Site: Abdomen

## 2014-10-10 MED ORDER — LACTATED RINGERS IV SOLN
INTRAVENOUS | Status: DC | PRN
  Administered 2014-10-10 (×2): via INTRAVENOUS

## 2014-10-10 MED ORDER — FENTANYL CITRATE 0.05 MG/ML IJ SOLN
INTRAMUSCULAR | Status: AC
  Filled 2014-10-10: qty 2

## 2014-10-10 MED ORDER — DIPHENHYDRAMINE HCL 25 MG PO CAPS: 25.0000 mg | ORAL_CAPSULE | ORAL | Status: DC | PRN

## 2014-10-10 MED ORDER — KETOROLAC TROMETHAMINE 30 MG/ML IJ SOLN: 30.0000 mg | Freq: Four times a day (QID) | INTRAMUSCULAR | Status: AC | PRN

## 2014-10-10 MED ORDER — PHENYLEPHRINE 40 MCG/ML (10ML) SYRINGE FOR IV PUSH (FOR BLOOD PRESSURE SUPPORT)
PREFILLED_SYRINGE | INTRAVENOUS | Status: AC
  Filled 2014-10-10: qty 5

## 2014-10-10 MED ORDER — LIDOCAINE-EPINEPHRINE (PF) 2 %-1:200000 IJ SOLN
INTRAMUSCULAR | Status: AC
  Filled 2014-10-10: qty 20

## 2014-10-10 MED ORDER — SODIUM BICARBONATE 8.4 % IV SOLN
INTRAVENOUS | Status: DC | PRN
  Administered 2014-10-10: 5 mL via EPIDURAL
  Administered 2014-10-10: 15 mL via EPIDURAL

## 2014-10-10 MED ORDER — OXYTOCIN 10 UNIT/ML IJ SOLN
INTRAMUSCULAR | Status: AC
  Filled 2014-10-10: qty 4

## 2014-10-10 MED ORDER — TERBUTALINE SULFATE 1 MG/ML IJ SOLN
INTRAMUSCULAR | Status: AC
  Administered 2014-10-10: 0.25 mg via SUBCUTANEOUS
  Filled 2014-10-10: qty 1

## 2014-10-10 MED ORDER — SIMETHICONE 80 MG PO CHEW
80.0000 mg | CHEWABLE_TABLET | ORAL | Status: DC
  Administered 2014-10-11 – 2014-10-14 (×2): 80 mg via ORAL
  Filled 2014-10-10 (×4): qty 1

## 2014-10-10 MED ORDER — SIMETHICONE 80 MG PO CHEW
80.0000 mg | CHEWABLE_TABLET | Freq: Three times a day (TID) | ORAL | Status: DC
Start: 2014-10-10 — End: 2014-10-14
  Administered 2014-10-10 – 2014-10-14 (×11): 80 mg via ORAL
  Filled 2014-10-10 (×11): qty 1

## 2014-10-10 MED ORDER — OXYCODONE-ACETAMINOPHEN 5-325 MG PO TABS
1.0000 | ORAL_TABLET | ORAL | Status: DC | PRN
  Administered 2014-10-10: 1 via ORAL
  Filled 2014-10-10: qty 1

## 2014-10-10 MED ORDER — PROPOFOL 10 MG/ML IV EMUL
INTRAVENOUS | Status: AC
  Filled 2014-10-10: qty 20

## 2014-10-10 MED ORDER — MEPERIDINE HCL 25 MG/ML IJ SOLN
INTRAMUSCULAR | Status: AC
  Filled 2014-10-10: qty 1

## 2014-10-10 MED ORDER — OXYCODONE-ACETAMINOPHEN 5-325 MG PO TABS
2.0000 | ORAL_TABLET | ORAL | Status: DC | PRN
  Administered 2014-10-11 – 2014-10-13 (×8): 2 via ORAL
  Filled 2014-10-10 (×9): qty 2

## 2014-10-10 MED ORDER — BUTORPHANOL TARTRATE 1 MG/ML IJ SOLN: 1.0000 mg | Freq: Once | INTRAMUSCULAR | Status: DC

## 2014-10-10 MED ORDER — KETOROLAC TROMETHAMINE 30 MG/ML IJ SOLN: 15.0000 mg | Freq: Once | INTRAMUSCULAR | Status: DC | PRN

## 2014-10-10 MED ORDER — MEPERIDINE HCL 25 MG/ML IJ SOLN
INTRAMUSCULAR | Status: DC | PRN
  Administered 2014-10-10 (×2): 12.5 mg via INTRAVENOUS

## 2014-10-10 MED ORDER — TETANUS-DIPHTH-ACELL PERTUSSIS 5-2.5-18.5 LF-MCG/0.5 IM SUSP: 0.5000 mL | Freq: Once | INTRAMUSCULAR | Status: DC

## 2014-10-10 MED ORDER — MORPHINE SULFATE 0.5 MG/ML IJ SOLN
INTRAMUSCULAR | Status: AC
  Filled 2014-10-10: qty 10

## 2014-10-10 MED ORDER — PROMETHAZINE HCL 25 MG/ML IJ SOLN: 6.2500 mg | INTRAMUSCULAR | Status: DC | PRN

## 2014-10-10 MED ORDER — ALBUTEROL SULFATE (2.5 MG/3ML) 0.083% IN NEBU
3.0000 mL | INHALATION_SOLUTION | Freq: Four times a day (QID) | RESPIRATORY_TRACT | Status: DC | PRN
  Administered 2014-10-10: 3 mL via RESPIRATORY_TRACT
  Filled 2014-10-10: qty 3

## 2014-10-10 MED ORDER — DIPHENHYDRAMINE HCL 50 MG/ML IJ SOLN: 12.5000 mg | INTRAMUSCULAR | Status: DC | PRN

## 2014-10-10 MED ORDER — NALBUPHINE HCL 10 MG/ML IJ SOLN: 5.0000 mg | INTRAMUSCULAR | Status: DC | PRN

## 2014-10-10 MED ORDER — KETOROLAC TROMETHAMINE 30 MG/ML IJ SOLN
INTRAMUSCULAR | Status: AC
  Filled 2014-10-10: qty 1

## 2014-10-10 MED ORDER — PRENATAL MULTIVITAMIN CH
1.0000 | ORAL_TABLET | Freq: Every day | ORAL | Status: DC
  Administered 2014-10-11 – 2014-10-14 (×3): 1 via ORAL
  Filled 2014-10-10 (×4): qty 1

## 2014-10-10 MED ORDER — ONDANSETRON HCL 4 MG/2ML IJ SOLN: 4.0000 mg | INTRAMUSCULAR | Status: DC | PRN

## 2014-10-10 MED ORDER — MORPHINE SULFATE (PF) 0.5 MG/ML IJ SOLN
INTRAMUSCULAR | Status: DC | PRN
  Administered 2014-10-10: 3 mg via EPIDURAL
  Administered 2014-10-10: 2 mg via EPIDURAL

## 2014-10-10 MED ORDER — HYDROMORPHONE HCL 1 MG/ML IJ SOLN
0.2500 mg | INTRAMUSCULAR | Status: DC | PRN
  Administered 2014-10-10 (×2): 0.5 mg via INTRAVENOUS

## 2014-10-10 MED ORDER — LACTATED RINGERS IV SOLN
INTRAVENOUS | Status: DC
  Administered 2014-10-10 (×2): via INTRAUTERINE

## 2014-10-10 MED ORDER — MEPERIDINE HCL 25 MG/ML IJ SOLN
6.2500 mg | INTRAMUSCULAR | Status: DC | PRN
  Administered 2014-10-10: 12.5 mg via INTRAVENOUS

## 2014-10-10 MED ORDER — WITCH HAZEL-GLYCERIN EX PADS: 1.0000 "application " | MEDICATED_PAD | CUTANEOUS | Status: DC | PRN

## 2014-10-10 MED ORDER — SIMETHICONE 80 MG PO CHEW: 80.0000 mg | CHEWABLE_TABLET | ORAL | Status: DC | PRN

## 2014-10-10 MED ORDER — FENTANYL 2.5 MCG/ML BUPIVACAINE 1/10 % EPIDURAL INFUSION (WH - ANES)
INTRAMUSCULAR | Status: DC | PRN
  Administered 2014-10-10: 14 mL/h via EPIDURAL

## 2014-10-10 MED ORDER — LANOLIN HYDROUS EX OINT: 1.0000 "application " | TOPICAL_OINTMENT | CUTANEOUS | Status: DC | PRN

## 2014-10-10 MED ORDER — ACETAMINOPHEN 10 MG/ML IV SOLN
1000.0000 mg | Freq: Four times a day (QID) | INTRAVENOUS | Status: DC
  Filled 2014-10-10: qty 100

## 2014-10-10 MED ORDER — SCOPOLAMINE 1 MG/3DAYS TD PT72
MEDICATED_PATCH | TRANSDERMAL | Status: AC
  Administered 2014-10-10: 1.5 mg via TRANSDERMAL
  Filled 2014-10-10: qty 1

## 2014-10-10 MED ORDER — LIDOCAINE HCL (PF) 1 % IJ SOLN
INTRAMUSCULAR | Status: DC | PRN
  Administered 2014-10-10: 9 mL
  Administered 2014-10-10: 8 mL

## 2014-10-10 MED ORDER — NALOXONE HCL 1 MG/ML IJ SOLN
1.0000 ug/kg/h | INTRAVENOUS | Status: DC | PRN
  Filled 2014-10-10: qty 2

## 2014-10-10 MED ORDER — DIBUCAINE 1 % RE OINT: 1.0000 "application " | TOPICAL_OINTMENT | RECTAL | Status: DC | PRN

## 2014-10-10 MED ORDER — HYDROMORPHONE HCL 1 MG/ML IJ SOLN
INTRAMUSCULAR | Status: AC
  Administered 2014-10-10: 0.5 mg via INTRAVENOUS
  Filled 2014-10-10: qty 1

## 2014-10-10 MED ORDER — SODIUM CHLORIDE 0.9 % IJ SOLN: 3.0000 mL | INTRAMUSCULAR | Status: DC | PRN

## 2014-10-10 MED ORDER — PHENYLEPHRINE HCL 10 MG/ML IJ SOLN
INTRAMUSCULAR | Status: DC | PRN
  Administered 2014-10-10 (×4): 80 ug via INTRAVENOUS

## 2014-10-10 MED ORDER — CEFAZOLIN SODIUM-DEXTROSE 2-3 GM-% IV SOLR
INTRAVENOUS | Status: AC
  Filled 2014-10-10: qty 50

## 2014-10-10 MED ORDER — OXYTOCIN 40 UNITS IN LACTATED RINGERS INFUSION - SIMPLE MED
INTRAVENOUS | Status: DC | PRN
  Administered 2014-10-10: 40 [IU] via INTRAVENOUS

## 2014-10-10 MED ORDER — DIPHENHYDRAMINE HCL 25 MG PO CAPS: 25.0000 mg | ORAL_CAPSULE | Freq: Four times a day (QID) | ORAL | Status: DC | PRN

## 2014-10-10 MED ORDER — FENTANYL CITRATE 0.05 MG/ML IJ SOLN
INTRAMUSCULAR | Status: DC | PRN
  Administered 2014-10-10 (×2): 50 ug via INTRAVENOUS

## 2014-10-10 MED ORDER — ONDANSETRON HCL 4 MG/2ML IJ SOLN: 4.0000 mg | Freq: Three times a day (TID) | INTRAMUSCULAR | Status: DC | PRN

## 2014-10-10 MED ORDER — ONDANSETRON HCL 4 MG/2ML IJ SOLN
INTRAMUSCULAR | Status: AC
  Filled 2014-10-10: qty 2

## 2014-10-10 MED ORDER — NALOXONE HCL 0.4 MG/ML IJ SOLN: 0.4000 mg | INTRAMUSCULAR | Status: DC | PRN

## 2014-10-10 MED ORDER — NALBUPHINE HCL 10 MG/ML IJ SOLN: 5.0000 mg | Freq: Once | INTRAMUSCULAR | Status: AC | PRN

## 2014-10-10 MED ORDER — ONDANSETRON HCL 4 MG PO TABS: 4.0000 mg | ORAL_TABLET | ORAL | Status: DC | PRN

## 2014-10-10 MED ORDER — MEPERIDINE HCL 25 MG/ML IJ SOLN: 6.2500 mg | INTRAMUSCULAR | Status: DC | PRN

## 2014-10-10 MED ORDER — ZOLPIDEM TARTRATE 5 MG PO TABS: 5.0000 mg | ORAL_TABLET | Freq: Every evening | ORAL | Status: DC | PRN

## 2014-10-10 MED ORDER — SENNOSIDES-DOCUSATE SODIUM 8.6-50 MG PO TABS
2.0000 | ORAL_TABLET | ORAL | Status: DC
  Administered 2014-10-11: 2 via ORAL
  Administered 2014-10-12: 1 via ORAL
  Administered 2014-10-14: 2 via ORAL
  Filled 2014-10-10 (×4): qty 2

## 2014-10-10 MED ORDER — OXYTOCIN 40 UNITS IN LACTATED RINGERS INFUSION - SIMPLE MED: 62.5000 mL/h | INTRAVENOUS | Status: AC

## 2014-10-10 MED ORDER — SCOPOLAMINE 1 MG/3DAYS TD PT72
1.0000 | MEDICATED_PATCH | Freq: Once | TRANSDERMAL | Status: AC
  Administered 2014-10-10: 1.5 mg via TRANSDERMAL

## 2014-10-10 MED ORDER — LACTATED RINGERS IV SOLN: INTRAVENOUS | Status: DC

## 2014-10-10 MED ORDER — MENTHOL 3 MG MT LOZG: 1.0000 | LOZENGE | OROMUCOSAL | Status: DC | PRN

## 2014-10-10 MED ORDER — ONDANSETRON HCL 4 MG/2ML IJ SOLN
INTRAMUSCULAR | Status: DC | PRN
  Administered 2014-10-10: 4 mg via INTRAVENOUS

## 2014-10-10 MED ORDER — IBUPROFEN 600 MG PO TABS
600.0000 mg | ORAL_TABLET | Freq: Four times a day (QID) | ORAL | Status: DC
  Administered 2014-10-10 – 2014-10-14 (×13): 600 mg via ORAL
  Filled 2014-10-10 (×15): qty 1

## 2014-10-10 MED ORDER — IBUPROFEN 600 MG PO TABS: 600.0000 mg | ORAL_TABLET | Freq: Four times a day (QID) | ORAL | Status: DC | PRN

## 2014-10-10 MED ORDER — KETOROLAC TROMETHAMINE 30 MG/ML IJ SOLN
30.0000 mg | Freq: Four times a day (QID) | INTRAMUSCULAR | Status: AC | PRN
  Administered 2014-10-10 (×2): 30 mg via INTRAVENOUS
  Filled 2014-10-10: qty 1

## 2014-10-10 SURGICAL SUPPLY — 32 items
BENZOIN TINCTURE PRP APPL 2/3 (GAUZE/BANDAGES/DRESSINGS) ×2 IMPLANT
CLAMP CORD UMBIL (MISCELLANEOUS) IMPLANT
CLOTH BEACON ORANGE TIMEOUT ST (SAFETY) ×2 IMPLANT
CONTAINER PREFILL 10% NBF 15ML (MISCELLANEOUS) IMPLANT
DRAPE SHEET LG 3/4 BI-LAMINATE (DRAPES) IMPLANT
DRSG OPSITE POSTOP 4X10 (GAUZE/BANDAGES/DRESSINGS) ×2 IMPLANT
DURAPREP 26ML APPLICATOR (WOUND CARE) ×2 IMPLANT
ELECT REM PT RETURN 9FT ADLT (ELECTROSURGICAL) ×2
ELECTRODE REM PT RTRN 9FT ADLT (ELECTROSURGICAL) ×1 IMPLANT
EXTRACTOR VACUUM M CUP 4 TUBE (SUCTIONS) IMPLANT
GLOVE BIO SURGEON STRL SZ7.5 (GLOVE) ×2 IMPLANT
GLOVE BIOGEL PI IND STRL 7.5 (GLOVE) ×1 IMPLANT
GLOVE BIOGEL PI INDICATOR 7.5 (GLOVE) ×1
GOWN STRL REUS W/TWL LRG LVL3 (GOWN DISPOSABLE) ×4 IMPLANT
KIT ABG SYR 3ML LUER SLIP (SYRINGE) IMPLANT
NEEDLE HYPO 25X5/8 SAFETYGLIDE (NEEDLE) IMPLANT
NS IRRIG 1000ML POUR BTL (IV SOLUTION) ×2 IMPLANT
PACK C SECTION WH (CUSTOM PROCEDURE TRAY) ×2 IMPLANT
PAD OB MATERNITY 4.3X12.25 (PERSONAL CARE ITEMS) ×2 IMPLANT
RETRACTOR WND ALEXIS 25 LRG (MISCELLANEOUS) ×1 IMPLANT
RTRCTR WOUND ALEXIS 25CM LRG (MISCELLANEOUS) ×2
STRIP CLOSURE SKIN 1/2X4 (GAUZE/BANDAGES/DRESSINGS) ×2 IMPLANT
SUT CHROMIC 2 0 CT 1 (SUTURE) ×2 IMPLANT
SUT MNCRL AB 3-0 PS2 27 (SUTURE) ×2 IMPLANT
SUT PLAIN 0 NONE (SUTURE) IMPLANT
SUT PLAIN 2 0 XLH (SUTURE) ×2 IMPLANT
SUT VIC AB 0 CT1 36 (SUTURE) ×2 IMPLANT
SUT VIC AB 0 CTX 36 (SUTURE) ×3
SUT VIC AB 0 CTX36XBRD ANBCTRL (SUTURE) ×3 IMPLANT
TOWEL OR 17X24 6PK STRL BLUE (TOWEL DISPOSABLE) ×2 IMPLANT
TRAY FOLEY CATH 14FR (SET/KITS/TRAYS/PACK) ×2 IMPLANT
WATER STERILE IRR 1000ML POUR (IV SOLUTION) ×2 IMPLANT

## 2014-10-10 NOTE — Plan of Care (Signed)
Problem: Consults Goal: Postpartum Patient Education (See Patient Education module for education specifics.) Outcome: Completed/Met Date Met:  10/10/14     

## 2014-10-10 NOTE — Anesthesia Procedure Notes (Signed)
Epidural Patient location during procedure: OB Start time: 10/10/2014 12:45 AM End time: 10/10/2014 12:49 AM  Staffing Anesthesiologist: Leilani AbleHATCHETT, Sharlot Sturkey Performed by: anesthesiologist   Preanesthetic Checklist Completed: patient identified, surgical consent, pre-op evaluation, timeout performed, IV checked, risks and benefits discussed and monitors and equipment checked  Epidural Patient position: sitting Prep: site prepped and draped and DuraPrep Patient monitoring: continuous pulse ox and blood pressure Approach: midline Location: L3-L4 Injection technique: LOR air  Needle:  Needle type: Tuohy  Needle gauge: 17 G Needle length: 9 cm and 9 Needle insertion depth: 5 cm cm Catheter type: closed end flexible Catheter size: 19 Gauge Catheter at skin depth: 10 cm Test dose: negative and Other  Assessment Sensory level: T9 Events: blood not aspirated, injection not painful, no injection resistance, negative IV test and no paresthesia  Additional Notes Reason for block:procedure for pain

## 2014-10-10 NOTE — Plan of Care (Signed)
Problem: Phase I Progression Outcomes Goal: Pain controlled with appropriate interventions Outcome: Completed/Met Date Met:  10/10/14     

## 2014-10-10 NOTE — Transfer of Care (Signed)
Immediate Anesthesia Transfer of Care Note  Patient: Cheryl Logan  Procedure(s) Performed: Procedure(s): CESAREAN SECTION (N/A)  Patient Location: PACU  Anesthesia Type:Epidural  Level of Consciousness: awake, oriented and patient cooperative  Airway & Oxygen Therapy: Patient Spontanous Breathing  Post-op Assessment: Report given to PACU RN and Post -op Vital signs reviewed and stable  Post vital signs: Reviewed and stable  Complications: No apparent anesthesia complications

## 2014-10-10 NOTE — Plan of Care (Signed)
Problem: Phase II Progression Outcomes Goal: Tolerating diet Outcome: Completed/Met Date Met:  10/10/14     

## 2014-10-10 NOTE — Plan of Care (Signed)
Problem: Phase I Progression Outcomes Goal: OOB as tolerated unless otherwise ordered Outcome: Completed/Met Date Met:  10/10/14 Goal: VS, stable, temp < 100.4 degrees F Outcome: Completed/Met Date Met:  10/10/14 Goal: Initial discharge plan identified Outcome: Completed/Met Date Met:  10/10/14 Goal: Other Phase I Outcomes/Goals Outcome: Completed/Met Date Met:  10/10/14

## 2014-10-10 NOTE — Lactation Note (Signed)
This note was copied from the chart of Cheryl Logan ([place]Ameliya Capece. Lactation Consultation Note New mom first baby. Very sleepy. Wanted assistance in latching. Mom has soft breast w/everted nipples. Baby alert and lip smacking. Latched well. Hand expression taught. Reviewed feeding positions. Mom encouraged to feed baby 8-12 times/24 hours and with feeding cues. Encouraged to call for assistance if needed and to verify proper latch.Mom encouraged to feed baby w/feeding cuesMother informed of post-discharge support and given phone number to the lactation department, including services for phone call assistance; out-patient appointments; and breastfeeding support group. List of other breastfeeding resources in the community given in the handout. Encouraged mother to call for problems or concerns related to breastfeeding.[facility] brochure given w/resources, support groups and LC services. Patient Name: Cheryl Logan ([place])Kanasia Clardy Today's Date: 10/10/2014 Reason for consult: Initial assessment   Maternal Data Has patient been taught Hand Expression?: Yes Does the patient have breastfeeding experience prior to this delivery?: No  Feeding Feeding Type: Breast Fed Length of feed: 10 min (still BF when left rm.)  LATCH Score/Interventions Latch: Grasps breast easily, tongue down, lips flanged, rhythmical sucking. Intervention(s): Breast massage;Adjust position;Assist with latch;Breast compression  Audible Swallowing: A few with stimulation Intervention(s): Skin to skin;Hand expression;Alternate breast massage  Type of Nipple: Everted at rest and after stimulation  Comfort (Breast/Nipple): Soft / non-tender     Hold (Positioning): Assistance needed to correctly position infant at breast and maintain latch. Intervention(s): Breastfeeding basics reviewed;Support Pillows;Position options;Skin to skin  LATCH Score: 8  Lactation Tools Discussed/Used     Consult Status Consult Status: Follow-up Date:  10/11/14 Follow-up type: In-patient    Charyl DancerCARVER, Marguetta Windish G 10/10/2014, 12:54 PM

## 2014-10-10 NOTE — Op Note (Signed)
Cesarean Section Procedure Note  Indications: Pt was taken emergently to OR by Dr. Candelaria CelesteJacob Stinson in house MD and Sherre ScarletKimberly Williams, CNM after pitocin was restarted at 1mu and patient had a tetanic ctx with fetal bradycardia.  Terbutaline was administered and FHT was recovered by the time they got the patient in the OR.    Pre-operative Diagnosis: 1.40 3/7wks 2.Tetanic Contraction 3.Fetal bradycardia   Post-operative Diagnosis: 1.40 3/7wks 2.Tetanic Contraction 3.Fetal bradycardia 4.Direct OP with body cord  Procedure: CESAREAN SECTION  Surgeon: Purcell NailsAngela Y Ruchel Brandenburger, MD    Assistants: Candelaria CelesteJacob Stinson, MD (first assist) Sherre ScarletKimberly Williams, CNM (2nd assist)  Anesthesia: Regional  Anesthesiologist: Leilani AbleFranklin Hatchett, MD   Procedure Details  The patient was taken to the operating room secondary to fetal bradycardia after the risks, benefits, complications, treatment options, and expected outcomes were discussed with the patient.  The patient concurred with the proposed plan, giving informed consent which was signed and witnessed. The patient was taken to Operating Room C-Section Suite, identified as [name]Denene Radigan and the procedure verified as C-Section Delivery. A Time Out was held and the above information confirmed.  After induction of anesthesia by obtaining a spinal, the patient was prepped and draped in the usual sterile manner. A Pfannenstiel skin incision was made and carried down through the subcutaneous tissue to the underlying layer of fascia.  The fascia was incised bilaterally and extended transversely bilaterally with the Mayo scissors. Kocher clamps were placed on the inferior aspect of the fascial incision and the underlying rectus muscle was separated from the fascia. The same was done on the superior aspect of the fascial incision.  The peritoneum was identified, entered bluntly and extended manually.  The utero-vesical peritoneal reflection was incised transversely and the bladder  flap was bluntly freed from the lower uterine segment. A low transverse uterine incision was made with the scalpel and extended bilaterally with the bandage scissors.  The infant was delivered in vertex direct occiput posterior position without difficulty.  After the umbilical cord was clamped and cut, the infant was handed to the awaiting pediatricians.  Cord blood was obtained for evaluation and cord gases sent as well.  An Alexis self-retaining retractor was placed.  The placenta was removed intact and appeared to be within normal limits. The uterus was cleared of all clots and debris. The uterine incision was closed with running interlocking sutures of 0 Vicryl and a second imbricating layer was performed as well.   Bilateral tubes and ovaries appeared to be within normal limits.  Good hemostasis was noted.  Copious irrigation was performed until clear.  The peritoneum was repaired with 2-0 chromic via a running suture.  The fascia was reapproximated with a running suture of 0 Vicryl. The subcutaneous tissue was reapproximated with 3 interrupted sutures of 2-0 plain.  The skin was reapproximated with a subcuticular suture of 3-0 monocryl.  Steristrips were applied.  Instrument, sponge, and needle counts were correct prior to abdominal closure and at the conclusion of the case.  The patient was awaiting transfer to the recovery room in good condition.  Findings: Live female infant with Apgars 7 at one minute and 9 at five minutes.  Normal appearing bilateral ovaries and fallopian tubes were noted.  Estimated Blood Loss:  600 ml         Drains: foley to gravity 50cc         Total IV Fluids: 2300ml         Specimens to Pathology: Placenta  Complications:  None; patient tolerated the procedure well.         Disposition: PACU - hemodynamically stable.         Condition: stable  Attending Attestation: I performed the procedure.

## 2014-10-10 NOTE — Plan of Care (Signed)
Problem: Phase I Progression Outcomes Goal: Foley catheter patent Outcome: Completed/Met Date Met:  10/10/14 Goal: IS, TCDB as ordered Outcome: Completed/Met Date Met:  10/10/14

## 2014-10-10 NOTE — Progress Notes (Signed)
  Subjective: Sleeping, yet easily aroused. States still in pain even though she has to be awakened for interventions/cervical exam. Family at bedside.  Objective: BP 111/56 mmHg  Pulse 80  Temp(Src) 98.3 F (36.8 C) (Oral)  Resp 18  Ht 5\' 3"  (1.6 m)  Wt 165 lb (74.844 kg)  BMI 29.24 kg/m2  SpO2 98%  LMP 12/04/2013      FHT: [id number] w/ mod variability. Earlys, variables and lates noted during labor course despite intrauterine resuscitative measures  UC:   irregular, every 1-4 minutes SVE:   5/100/-1   Assessment:  Cat 2 FHRT w/ intermittent lates despite intrauterine measures. Discussed with Dr. Su Hiltoberts.  Plan: D/C Pitocin. [facility].   Sherre ScarletWILLIAMS, Teshawn Moan CNM 10/10/2014, 4:11 AM  ADDENDUM: Pitocin restarted at 1 milliunit/min after approximately 1 hr. FHRT Cat 1. Discussed with Dr. Su Hiltoberts.   ADDENDUM: Pitocin d/c'd within 5 minutes after restart secondary to repetitive late decels. Suspected tachysystole. Terbutaline administered. Discussed with Dr. Su Hiltoberts. Plan to proceed w/ c-section for fetal intolerance to labor. Given FHRT, Faculty contacted for evaluation while Dr. Su Hiltoberts enroute.  Sherre ScarletKimberly Jeriann Sayres, CNM 10/10/2014, 5:30 AM

## 2014-10-10 NOTE — Anesthesia Preprocedure Evaluation (Signed)
Anesthesia Evaluation  Patient identified by MRN, date of birth, ID band Patient awake    Reviewed: Allergy & Precautions, H&P , NPO status , Patient's Chart, lab work & pertinent test results  Airway Mallampati: I  TM Distance: >3 FB Neck ROM: full    Dental no notable dental hx.    Pulmonary neg pulmonary ROS,    Pulmonary exam normal       Cardiovascular negative cardio ROS      Neuro/Psych negative neurological ROS  negative psych ROS   GI/Hepatic Neg liver ROS, GERD-  ,  Endo/Other  negative endocrine ROS  Renal/GU      Musculoskeletal   Abdominal Normal abdominal exam  (+)   Peds  Hematology negative hematology ROS (+)   Anesthesia Other Findings   Reproductive/Obstetrics (+) Pregnancy                             Anesthesia Physical Anesthesia Plan  ASA: II  Anesthesia Plan: Epidural   Post-op Pain Management:    Induction:   Airway Management Planned:   Additional Equipment:   Intra-op Plan:   Post-operative Plan:   Informed Consent: I have reviewed the patients History and Physical, chart, labs and discussed the procedure including the risks, benefits and alternatives for the proposed anesthesia with the patient or authorized representative who has indicated his/her understanding and acceptance.     Plan Discussed with:   Anesthesia Plan Comments:         Anesthesia Quick Evaluation

## 2014-10-10 NOTE — Anesthesia Postprocedure Evaluation (Signed)
Anesthesia Post Note  Patient: Cheryl Logan  Procedure(s) Performed: Procedure(s) (LRB): CESAREAN SECTION (N/A)  Anesthesia type: Epidural  Patient location: PACU  Post pain: Pain level controlled  Post assessment: Post-op Vital signs reviewed  Last Vitals:  Filed Vitals:   10/10/14 0615  BP: 97/43  Pulse: 129  Temp:   Resp: 26    Post vital signs: Reviewed  Level of consciousness: awake  Complications: No apparent anesthesia complications

## 2014-10-10 NOTE — Progress Notes (Addendum)
Late documentation  Called to patient's room at 4:45am due to prolonged fetal bradycardia due to tetanic contraction.  Risks discussed with the patient, stat cesarean section called.  Risks discussed with the patient.  Terbutaline given to patient.  Pt NPO since last night.  When arrived in operating room, fetal heart tones in the 110s.  Operation started until primary obstetrician arrived, then remained and assisted Dr Su Hiltoberts for the remainder of the operation.    Candelaria CelesteJacob Stinson, DO 10/10/2014  5:37 AM

## 2014-10-10 NOTE — Progress Notes (Signed)
[name]  Postpartum Day  LOS: 1  Subjective -Patient reports feeling better.  Pain controlled, but unsure of what she has taken.  Denies NV and reports no issues with CLD.   Objective  Filed Vitals:   10/10/14 0730 10/10/14 0745 10/10/14 0810 10/10/14 0910  BP: 124/82 106/47 107/56 115/62  Pulse: 121 105 108 115  Temp:  98.4 F (36.9 C) 98.6 F (37 C) 98.4 F (36.9 C)  TempSrc:    Oral  Resp: 20 20 18 18   Height:      Weight:      SpO2: 99% 96% 96% 97%    HEMOGLOBIN  Date/Time Value Ref Range Status  10/09/2014 12:30 PM 11.8* 12.0 - 15.0 g/dL Final  16/10/960411/25/2015 54:0911:07 AM 11.5* 12.0 - 15.0 g/dL Final   HCT  Date/Time Value Ref Range Status  10/09/2014 12:30 PM 35.2* 36.0 - 46.0 % Final  09/27/2014 11:07 AM 33.8* 36.0 - 46.0 % Final   PLATELETS  Date/Time Value Ref Range Status  10/09/2014 12:30 PM 314 150 - 400 K/uL Final  09/27/2014 11:07 AM 281 150 - 400 K/uL Final    Physical Exam  Constitutional: She is oriented to person, place, and time. She appears well-developed and well-nourished. No distress.  Cardiovascular: Normal rate and regular rhythm.   Pulmonary/Chest: Effort normal and breath sounds normal. No respiratory distress.  Abdominal: Soft. Bowel sounds are normal. She exhibits no distension. There is tenderness.  Honeycomb dressing: CDI Fundus firm at umbilicus  Genitourinary:  Lochia small  Neurological: She is alert and oriented to person, place, and time.  Skin: Skin is warm and dry.  Psychiatric: She has a normal mood and affect. Her behavior is normal.  Vitals reviewed.   Assessment Postpartum Day 0 6Hrs Post-Op S/P Primary C/S due to Fetal Bradycardia Hemodynamically Stable  Plan -Continue mgmt as ordered -Patient desires outpt circumcision for infant -Questions regarding recovery and hospital stay addressed -Will continue to monitor  [facility]oumaEMLY, Cheryl Kuhnle LYNN, MSN, CNM 10/10/2014, 11:54 AM

## 2014-10-11 ENCOUNTER — Encounter (HOSPITAL_COMMUNITY): Payer: Self-pay | Admitting: Obstetrics and Gynecology

## 2014-10-11 LAB — CBC
HCT: 26.6 % — ABNORMAL LOW (ref 36.0–46.0)
Hemoglobin: 8.8 g/dL — ABNORMAL LOW (ref 12.0–15.0)
MCH: 27.4 pg (ref 26.0–34.0)
MCHC: 33.1 g/dL (ref 30.0–36.0)
MCV: 82.9 fL (ref 78.0–100.0)
Platelets: 238 10*3/uL (ref 150–400)
RBC: 3.21 MIL/uL — ABNORMAL LOW (ref 3.87–5.11)
RDW: 13.7 % (ref 11.5–15.5)
WBC: 15.1 10*3/uL — ABNORMAL HIGH (ref 4.0–10.5)

## 2014-10-11 MED ORDER — FERROUS SULFATE 325 (65 FE) MG PO TABS
325.0000 mg | ORAL_TABLET | Freq: Two times a day (BID) | ORAL | Status: DC
  Administered 2014-10-11 – 2014-10-14 (×3): 325 mg via ORAL
  Filled 2014-10-11 (×5): qty 1

## 2014-10-11 NOTE — Anesthesia Postprocedure Evaluation (Signed)
  Anesthesia Post-op Note  Patient: Cheryl Logan  Procedure(s) Performed: Procedure(s): CESAREAN SECTION (N/A)  Patient Location: Mother/Baby  Anesthesia Type:Epidural  Level of Consciousness: awake  Airway and Oxygen Therapy: Patient Spontanous Breathing  Post-op Pain: mild  Post-op Assessment: Post-op Vital signs reviewed, Patient's Cardiovascular Status Stable, Respiratory Function Stable, Patent Airway, No signs of Nausea or vomiting, NAUSEA AND VOMITING PRESENT, Pain level controlled, No headache, No backache, No residual numbness and No residual motor weakness  Post-op Vital Signs: Reviewed and stable  Last Vitals:  Filed Vitals:   10/11/14 0542  BP: 86/59  Pulse: 88  Temp: 36.7 C  Resp: 18    Complications: No apparent anesthesia complications

## 2014-10-11 NOTE — Progress Notes (Addendum)
Subjective: Postpartum Day 1: Cesarean Delivery due to bradycardia Patient up ad lib, reports no syncope, dizziness or HA +Flatus, no BM Feeding:  breast Contraceptive plan:  Nexplanon  Objective: Vital signs in last 24 hours: Temp:  [98 F (36.7 C)-98.7 F (37.1 C)] 98 F (36.7 C) (12/09 0542) Pulse Rate:  [82-115] 88 (12/09 0542) Resp:  [18-20] 18 (12/09 0542) BP: (86-133)/(58-77) 86/59 mmHg (12/09 0542) SpO2:  [92 %-100 %] 100 % (12/09 0542)  Physical Exam:  General: alert and cooperative Lochia: appropriate Uterine Fundus: firm Abdomen:  + bowel sounds, non distended Incision: healing well, no significant drainage  Honeycomb dressing CDI DVT Evaluation: No evidence of DVT seen on physical exam. Homan's sign: Negative   Recent Labs  10/09/14 1230 10/11/14 0545  HGB 11.8* 8.8*  HCT 35.2* 26.6*  WBC 11.5* 15.1*     Assessment: Status post Cesarean section day 1. Doing well postoperatively.  Honeycomb dressing in place, no significant drainage Anemia - hemodynamicly stable.    Plan: Continue current care. Breastfeeding  Iron supplement     Venus Standard, CNM, MSN 10/11/2014. 10:33 AM  I saw and examined patient at bedside 12 PM and agree with above findings assessment and plan. Patient to get iron tablets anemia. Dr. Sallye OberKULWA.

## 2014-10-11 NOTE — Plan of Care (Signed)
Problem: Phase II Progression Outcomes Goal: Afebrile, VS remain stable Outcome: Completed/Met Date Met:  10/11/14     

## 2014-10-11 NOTE — Plan of Care (Signed)
Problem: Phase I Progression Outcomes Goal: Voiding adequately Outcome: Completed/Met Date Met:  10/11/14  Problem: Phase II Progression Outcomes Goal: Pain controlled on oral analgesia Outcome: Completed/Met Date Met:  10/11/14 Goal: Progress activity as tolerated unless otherwise ordered Outcome: Completed/Met Date Met:  10/11/14

## 2014-10-11 NOTE — Addendum Note (Signed)
Addendum  created 10/11/14 0916 by Suella Groveoderick C Mikiah Durall, CRNA   Modules edited: Notes Section   Notes Section:  File: [id number]

## 2014-10-11 NOTE — Lactation Note (Signed)
This note was copied from the chart of Cheryl LoganMaybel Diviney. Lactation Consultation Note  Patient Name: Cheryl LoganEllana Logan Today's Date: 10/11/2014 Reason for consult: Follow-up assessment Baby 33 hours of life. Mom reports baby not staying on breast very long. Assisted mom to hand express colostrum from both breast. Assisted mom to latch baby to right breast in football position. Baby able to latch deeply when breast is compressed and held until baby suckles it in deeply. Baby is able to maintain a deep latch, suckling rhythmically with lips flanged and a few swallows noted. Parents state that this is the longest baby has been able to maintain a latch. After about 10 minutes of BF baby pulls off breast but continues to cue to nurse. Enc mom to latch baby again but she was not able to obtain a deep latch. Assisted mom to re-latch baby deeply again. Enc mom to re-latch baby if baby slips or pulls off--to maintain a deep latch. Enc mom to nurse with cues and nurse until baby no longer cueing to feed. Discussed cluster-feeding with parents. Enc mom to call out for assistance with latching as needed.   Maternal Data    Feeding Feeding Type: Breast Fed Length of feed: 10 min  LATCH Score/Interventions Latch: Grasps breast easily, tongue down, lips flanged, rhythmical sucking. Intervention(s): Breast compression;Assist with latch  Audible Swallowing: A few with stimulation Intervention(s): Hand expression  Type of Nipple: Everted at rest and after stimulation  Comfort (Breast/Nipple): Soft / non-tender     Hold (Positioning): Assistance needed to correctly position infant at breast and maintain latch. Intervention(s): Breastfeeding basics reviewed;Support Pillows  LATCH Score: 8  Lactation Tools Discussed/Used     Consult Status Consult Status: Follow-up Date: 10/12/14 Follow-up type: In-patient    Geralynn OchsWILLIARD, Klark Vanderhoef 10/11/2014, 2:04 PM

## 2014-10-12 ENCOUNTER — Inpatient Hospital Stay (HOSPITAL_COMMUNITY): Payer: Medicaid Other

## 2014-10-12 DIAGNOSIS — Z98891 History of uterine scar from previous surgery: Secondary | ICD-10-CM

## 2014-10-12 LAB — CBC WITH DIFFERENTIAL/PLATELET
Basophils Absolute: 0 10*3/uL (ref 0.0–0.1)
Basophils Relative: 0 % (ref 0–1)
Eosinophils Absolute: 0.7 10*3/uL (ref 0.0–0.7)
Eosinophils Relative: 6 % — ABNORMAL HIGH (ref 0–5)
HCT: 27.1 % — ABNORMAL LOW (ref 36.0–46.0)
Hemoglobin: 8.9 g/dL — ABNORMAL LOW (ref 12.0–15.0)
Lymphocytes Relative: 12 % (ref 12–46)
Lymphs Abs: 1.4 10*3/uL (ref 0.7–4.0)
MCH: 27.2 pg (ref 26.0–34.0)
MCHC: 32.8 g/dL (ref 30.0–36.0)
MCV: 82.9 fL (ref 78.0–100.0)
Monocytes Absolute: 0.8 10*3/uL (ref 0.1–1.0)
Monocytes Relative: 7 % (ref 3–12)
Neutro Abs: 8.7 10*3/uL — ABNORMAL HIGH (ref 1.7–7.7)
Neutrophils Relative %: 75 % (ref 43–77)
Platelets: 244 10*3/uL (ref 150–400)
RBC: 3.27 MIL/uL — ABNORMAL LOW (ref 3.87–5.11)
RDW: 13.8 % (ref 11.5–15.5)
WBC: 11.6 10*3/uL — ABNORMAL HIGH (ref 4.0–10.5)

## 2014-10-12 MED ORDER — DEXTROSE-NACL 5-0.45 % IV SOLN
INTRAVENOUS | Status: DC
  Administered 2014-10-12: 18:00:00 via INTRAVENOUS

## 2014-10-12 MED ORDER — IOHEXOL 300 MG/ML  SOLN
50.0000 mL | INTRAMUSCULAR | Status: AC
Start: 2014-10-12 — End: 2014-10-12
  Administered 2014-10-12: 50 mL via ORAL

## 2014-10-12 MED ORDER — IOHEXOL 300 MG/ML  SOLN
100.0000 mL | Freq: Once | INTRAMUSCULAR | Status: AC | PRN
  Administered 2014-10-12: 100 mL via INTRAVENOUS

## 2014-10-12 NOTE — Progress Notes (Addendum)
Subjective: Postpartum Day 2: [facility] Patient up ad lib, reports no syncope or dizziness. C/o upper abdominal pain and distension--denies N/V, has had BM, passing flatus.  Normal appetite. Feeding:  Breast Contraceptive plan:  Nexplanon  Objective: Vital signs in last 24 hours: Temp:  [97.8 F (36.6 C)-98 F (36.7 C)] 98 F (36.7 C) (12/10 0601) Pulse Rate:  [87] 87 (12/10 0601) Resp:  [19] 19 (12/09 1800) BP: (117-123)/(69-82) 117/69 mmHg (12/10 0601) SpO2:  [99 %] 99 % (12/10 0601)   Filed Vitals:   10/11/14 0100 10/11/14 0542 10/11/14 1800 10/12/14 0601  BP:  86/59 123/82 117/69  Pulse:  88 87 87  Temp: 98.1 F (36.7 C) 98 F (36.7 C) 97.8 F (36.6 C) 98 F (36.7 C)  TempSrc: Oral Oral Oral Oral  Resp:  18 19   Height:      Weight:      SpO2: 100% 100%  99%     Physical Exam:  General: alert Lochia: appropriate Uterine Fundus: firm Abdomen:  + bowel sounds in all quadrants, focal upper abdominal distension, not generalized over entire upper quadrant.  Harder area to right of umbilicus.  Upper abdomen is tympanic to percussion. Incision: Honeycomb dressing CDI DVT Evaluation: No evidence of DVT seen on physical exam. Negative Homan's sign.    Recent Labs  10/09/14 1230 10/11/14 0545  HGB 11.8* 8.8*  HCT 35.2* 26.6*  WBC 11.5* 15.1*    Assessment/Plan: Status post Cesarean section day 2 Upper abdominal distension Anemia without hemodynamic instability Consulted with Dr. Estanislado Pandyivard Abdominal flat plate/2 view Xray CBC with diff.    Nigel BridgemanLATHAM, VICKI CNM, [place] 10/12/2014, 11:41 AM  Patient seen Complains of worsening abdominal pain which is improved with pain medication but worsened by standing and walking Denies nausea or vomiting. Reports loose stools but no flatus. Tolerates normal diet.   Abdominal exam: 6 x 6 cm area of distended bowel loops centrally and above the umbilicus which appears to be immediately under the skin. BS  present in all quadrants without high pitch sounds. No guarding. Mild rebound. Flat plate of abdomen with possible ileus versus partial small bowel obstruction CBC unchanged with stable hgb and WBC 11 .6 down from 15.1  Impression: herniated bowels? Will order a CT-scan and follow

## 2014-10-12 NOTE — Progress Notes (Signed)
Came by to see patient to discuss plan of care in light of likely ileus.  CT scan:   IMPRESSION: Expected postop changes from recent C-section with small amount of postop free air and mild colonic ileus.  No evidence of postop hematoma, abscess, or bowel obstruction.  Mild symmetric bilateral hydronephrosis of pregnancy. No evidence of ureteral injury.  Small bilateral pleural effusions, bibasilar atelectasis, and diffuse body wall edema.  Patient still feel bloated.  Has walked a little this afternoon. No evidence herniation of bowel. Per consult with Dr. Estanislado Pandyivard, plan NPO overnight, continuous IV hydration.  Patient unhappy with plan, but seems to overall understand our recommendation/order for NPO.  After long discussion, I will allow ice chips. Also wants IV to be restarted in lower arm (had been restarted near antecubital space, and is bothersome).  Advised patient I would have [facility] provider see her early in the am for decision regarding resuming diet.  Nigel BridgemanVicki Amberrose Friebel, CNM 10/12/14 7:40p

## 2014-10-12 NOTE — Lactation Note (Signed)
This note was copied from the chart of Cheryl [name]ea Sautter. Lactation Consultation Note  Follow up visit made.  Baby is in football hold nursing actively.  Breasts are becoming full.  Reminded mom to keep baby close to breast during feeding.  Encouraged to call with concerns prn.  Patient Name: Cheryl Logan Today's Date: 10/12/2014     Maternal Data    Feeding Feeding Type: Breast Fed Length of feed: 15 min  LATCH Score/Interventions                      Lactation Tools Discussed/Used     Consult Status      Huston FoleyMOULDEN, Aleka Twitty S 10/12/2014, 1:58 PM

## 2014-10-12 NOTE — Plan of Care (Signed)
Problem: Phase II Progression Outcomes Goal: Incision intact & without signs/symptoms of infection Outcome: Completed/Met Date Met:  10/12/14 Goal: Other Phase II Outcomes/Goals Outcome: Not Applicable Date Met:  77/11/65

## 2014-10-13 ENCOUNTER — Inpatient Hospital Stay (HOSPITAL_COMMUNITY): Payer: Medicaid Other

## 2014-10-13 MED ORDER — FERROUS SULFATE 325 (65 FE) MG PO TABS: 325.0000 mg | ORAL_TABLET | Freq: Two times a day (BID) | ORAL | Status: DC

## 2014-10-13 MED ORDER — OXYCODONE-ACETAMINOPHEN 5-325 MG PO TABS: 1.0000 | ORAL_TABLET | ORAL | Status: DC | PRN

## 2014-10-13 MED ORDER — IBUPROFEN 600 MG PO TABS: 600.0000 mg | ORAL_TABLET | Freq: Four times a day (QID) | ORAL | Status: DC | PRN

## 2014-10-13 NOTE — Plan of Care (Signed)
Problem: Discharge Progression Outcomes Goal: Remove staples per MD order Outcome: Not Applicable Date Met:  54/27/06

## 2014-10-13 NOTE — Lactation Note (Signed)
This note was copied from the chart of Cheryl LoganTanesia Logan. Lactation Consultation Note  Patient Name: Cheryl LoganTamzin Wanzer Today's Date: 10/13/2014 Reason for consult: Follow-up assessment  Mom is considering going home today. She has called WIC and left message to see about getting pump for home use. [facility] Mom was set up with [facility] due to engorgement during the night. Mom reports baby is latching well. LC reported to Mom if baby is latching well she may not be [facility]. Mom reports she wants to pump when she is tired. Mom was evaluated for possible ileus this am and not sure of d/c at this time. She has been pump/bottle feeding during the night. Baby asleep at this visit. Mom reports her breasts are no longer engorged. [facility]   Maternal Data    Feeding Feeding Type: Bottle Fed - Breast Milk  LATCH Score/Interventions                      Lactation Tools Discussed/Used Tools: Pump Breast pump type: Double-Electric Breast Pump WIC Program: Yes   Consult Status Consult Status: Follow-up Date: 10/13/14 Follow-up type: In-patient    Alfred LevinsGranger, Trianna Lupien Ann 10/13/2014, 3:19 PM

## 2014-10-13 NOTE — Discharge Summary (Signed)
Cesarean Section Delivery Discharge Summary  [name]  DOB:    12-31-1995 MRN:    [id number] CSN:    [id number]  Date of admission:                  10/09/14  Date of discharge:                   10/13/14  Procedures this admission:  Primary CS  Date of Delivery: 10/10/14  Newborn Data:  Live born  Information for the patient's newborn:  Cindra Presumeorrence, Boy [name] [[id number]][[id number]]  female  Live born female  Birth Weight: 7 lb 10.8 oz (3480 g) APGAR: 7, 9  Home with mother. Name: Tavari Circumcision Plan: OP  History of Present Illness:  Ms. [name] is a 18 y.o. female, G1P1001, who presents at 3665w3d weeks gestation. [facility]    Her pregnancy has been complicated by:  Patient Active Problem List   Diagnosis Date Noted  . Status post primary low transverse cesarean section 10/12/2014  . Normal labor 10/09/2014  . 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 Multiple food allergies--banana, fish oil, peanut 10/09/2014  . Asthma, chronic 10/09/2014  . Eczema 10/09/2014  . GBS (group B streptococcus) UTI complicating pregnancy--07/16/14 08/07/2014  . Short cervix 07/18/2014  . Pyelonephritis complicating pregnancy, antepartum 07/17/2014    Hospital course: The patient was admitted for labor.   Her postpartum course was not complicated.  She was discharged to home on postpartum day 3 doing well.  Feeding: breast  Contraception: unsure Pt understands the risks are but not limited to irregular bleeding, formation of DVT, fluid fluctuations, elevation in blood pressure, stroke, breast tenderness and liver damage.  She states she will report any serious side effects.  She has been given verbal and written instructions and voiced a clear understanding.    Discharge hemoglobin: HEMOGLOBIN  Date Value Ref Range Status  10/12/2014 8.9* 12.0 - 15.0 g/dL Final   HCT  Date  Value Ref Range Status  10/12/2014 27.1* 36.0 - 46.0 % Final   Anemia - hemodynamicly stable.    PreNatal Labs ABO, Rh: --/--/B POS, B POS (12/07 1230)   Antibody: NEG (12/07 1230) Rubella:   immune RPR: NON REAC (12/07 1230)  HBsAg: Negative (04/30 0000)  HIV: NONREACTIVE (12/07 1230)  GBS: Positive (08/27 0000)  ABD scan  FINDINGS: Upright and supine views. No pneumoperitoneum. Patchy lung base opacity. Oral contrast administered yesterday is now within the colon. Continued mild gaseous distension of colon. Distal stool. Overall nonobstructed pattern. Increased pelvic and lower abdominal density compatible with postpartum uterine enlargement. No osseous abnormality identified.   Discharge Physical Exam:  General: alert and cooperative Lochia: appropriate Uterine Fundus: firm Incision: healing well DVT Evaluation: No evidence of DVT seen on physical exam.  Intrapartum Procedures: cesarean: low cervical, transverse and GBS prophylaxis Postpartum Procedures: none Complications-Operative and Postpartum: none + BM Discharge Diagnoses: Term Pregnancy-delivered, anemic  Discharge Information:  Activity:           unrestricted Diet:                routine Medications: PNV, Ibuprofen, Iron and Percocet Condition:      stable   Newborn Data: Live born  Information for the patient's newborn:  Nani Gasserorrence, Boy [name] [[id number]][[id number]]  female ; APGAR , ; weight  ;  Home with mother.  Discharge to: home  Follow-up Information    Follow up with [facility]. Schedule an appointment as soon as possible for a visit in 2 weeks.   Specialty:  Obstetrics and Gynecology   Why:  For wound re-check   Contact information:   [address] [place] [address] [phone]      Follow up with [facility]. Schedule an appointment as soon as possible for a visit in 6 weeks.   Specialty:  Obstetrics  and Gynecology   Why:  Postpartum check up   Contact information:   [address] [place] [address] [place] [id number] [phone]       Adelina Mings, CNM, MSN  10/13/2014. 10:57 AM  Care After Cesarean Delivery  Refer to this sheet in the next few weeks. These instructions provide you with information on caring for yourself after your procedure. Your caregiver may also give you specific instructions. Your treatment has been planned according to current medical practices, but problems sometimes occur. Call your caregiver if you have any problems or questions after you go home. HOME CARE INSTRUCTIONS  Only take over-the-counter or prescription medicines as directed by your caregiver.  Do not drink alcohol, especially if you are breastfeeding or taking medicine to relieve pain.  Do not chew or smoke tobacco.  Continue to use good perineal care. Good perineal care includes:  Wiping your perineum from front to back.  Keeping your perineum clean.  Check your cut (incision) daily for increased redness, drainage, swelling, or separation of skin.  Clean your incision gently with soap and water every day, and then pat it dry. If your caregiver says it is okay, leave the incision uncovered. Use a bandage (dressing) if the incision is draining fluid or appears irritated. If the adhesive strips across the incision do not fall off within 7 days, carefully peel them off.  Hug a pillow when coughing or sneezing until your incision is healed. This helps to relieve pain.  Do not use tampons or douche until your caregiver says it is okay.  Shower, wash your hair, and take tub baths as directed by your caregiver.  Wear a well-fitting bra that provides breast support.  Limit wearing support panties or control-top hose.  Drink enough fluids to keep your urine clear or pale yellow.  Eat high-fiber foods such as whole grain cereals and breads, brown rice, beans, and fresh  fruits and vegetables every day. These foods may help prevent or relieve constipation.  Resume activities such as climbing stairs, driving, lifting, exercising, or traveling as directed by your caregiver.  Talk to your caregiver about resuming sexual activities. This is dependent upon your risk of infection, your rate of healing, and your comfort and desire to resume sexual activity.  Try to have someone help you with your household activities and your newborn for at least a few days after you leave the hospital.  Rest as much as possible. Try to rest or take a nap when your newborn is sleeping.  Increase your activities gradually.  Keep all of your scheduled postpartum appointments. It is very important to keep your scheduled follow-up appointments. At these appointments, your caregiver will be checking to make sure that you are healing physically and emotionally. SEEK MEDICAL CARE IF:   You are passing large clots from your vagina. Save any clots to show your caregiver.  You have a foul smelling discharge from your vagina.  You have trouble urinating.  You  are urinating frequently.  You have pain when you urinate.  You have a change in your bowel movements.  You have increasing redness, pain, or swelling near your incision.  You have pus draining from your incision.  Your incision is separating.  You have painful, hard, or reddened breasts.  You have a severe headache.  You have blurred vision or see spots.  You feel sad or depressed.  You have thoughts of hurting yourself or your newborn.  You have questions about your care, the care of your newborn, or medicines.  You are dizzy or lightheaded.  You have a rash.  You have pain, redness, or swelling at the site of the removed intravenous access (IV) tube.  You have nausea or vomiting.  You stopped breastfeeding and have not had a menstrual period within 12 weeks of stopping.  You are not breastfeeding and have  not had a menstrual period within 12 weeks of delivery.  You have a fever. SEEK IMMEDIATE MEDICAL CARE IF:  You have persistent pain.  You have chest pain.  You have shortness of breath.  You faint.  You have leg pain.  You have stomach pain.  Your vaginal bleeding saturates 2 or more sanitary pads in 1 hour. MAKE SURE YOU:   Understand these instructions.  Will watch your condition.  Will get help right away if you are not doing well or get worse. Document Released: 07/12/2002 Document Revised: 07/14/2012 Document Reviewed: 06/16/2012 [facility] Patient Information 2014 [place] [place].   Postpartum Depression and Baby Blues  The postpartum period begins right after the birth of a baby. During this time, there is often a great amount of joy and excitement. It is also a time of considerable changes in the life of the parent(s). Regardless of how many times a mother gives birth, each child brings new challenges and dynamics to the family. It is not unusual to have feelings of excitement accompanied by confusing shifts in moods, emotions, and thoughts. All mothers are at risk of developing postpartum depression or the "baby blues." These mood changes can occur right after giving birth, or they may occur many months after giving birth. The baby blues or postpartum depression can be mild or severe. Additionally, postpartum depression can resolve rather quickly, or it can be a long-term condition. CAUSES Elevated hormones and their rapid decline are thought to be a main cause of postpartum depression and the baby blues. There are a number of hormones that radically change during and after pregnancy. Estrogen and progesterone usually decrease immediately after delivering your baby. The level of thyroid hormone and various cortisol steroids also rapidly drop. Other factors that play a major role in these changes include major life events and genetics.  RISK FACTORS If you have any of the  following risks for the baby blues or postpartum depression, know what symptoms to watch out for during the postpartum period. Risk factors that may increase the likelihood of getting the baby blues or postpartum depression include: 1. Havinga personal or family history of depression. 2. Having depression while being pregnant. 3. Having premenstrual or oral contraceptive-associated mood issues. 4. Having exceptional life stress. 5. Having marital conflict. 6. Lacking a social support network. 7. Having a baby with special needs. 8. Having health problems such as diabetes. SYMPTOMS Baby blues symptoms include:  Brief fluctuations in mood, such as going from extreme happiness to sadness.  Decreased concentration.  Difficulty sleeping.  Crying spells, tearfulness.  Irritability.  Anxiety. Postpartum depression  symptoms typically begin within the first month after giving birth. These symptoms include:  Difficulty sleeping or excessive sleepiness.  Marked weight loss.  Agitation.  Feelings of worthlessness.  Lack of interest in activity or food. Postpartum psychosis is a very concerning condition and can be dangerous. Fortunately, it is rare. Displaying any of the following symptoms is cause for immediate medical attention. Postpartum psychosis symptoms include:  Hallucinations and delusions.  Bizarre or disorganized behavior.  Confusion or disorientation. DIAGNOSIS  A diagnosis is made by an evaluation of your symptoms. There are no medical or lab tests that lead to a diagnosis, but there are various questionnaires that a caregiver may use to identify those with the baby blues, postpartum depression, or psychosis. Often times, a screening tool called the [place] Postnatal Depression Scale is used to diagnose depression in the postpartum period.  TREATMENT The baby blues usually goes away on its own in 1 to 2 weeks. Social support is often all that is needed. You should be  encouraged to get adequate sleep and rest. Occasionally, you may be given medicines to help you sleep.  Postpartum depression requires treatment as it can last several months or longer if it is not treated. Treatment may include individual or group therapy, medicine, or both to address any social, physiological, and psychological factors that may play a role in the depression. Regular exercise, a healthy diet, rest, and social support may also be strongly recommended.  Postpartum psychosis is more serious and needs treatment right away. Hospitalization is often needed. HOME CARE INSTRUCTIONS  Get as much rest as you can. Nap when the baby sleeps.  Exercise regularly. Some women find yoga and walking to be beneficial.  Eat a balanced and nourishing diet.  Do little things that you enjoy. Have a cup of tea, take a bubble bath, read your favorite magazine, or listen to your favorite music.  Avoid alcohol.  Ask for help with household chores, cooking, grocery shopping, or running errands as needed. Do not try to do everything.  Talk to people close to you about how you are feeling. Get support from your partner, family members, friends, or other new moms.  Try to stay positive in how you think. Think about the things you are grateful for.  Do not spend a lot of time alone.  Only take medicine as directed by your caregiver.  Keep all your postpartum appointments.  Let your caregiver know if you have any concerns. SEEK MEDICAL CARE IF: You are having a reaction or problems with your medicine. SEEK IMMEDIATE MEDICAL CARE IF:  You have suicidal feelings.  You feel you may harm the baby or someone else. Document Released: 07/24/2004 Document Revised: 01/12/2012 Document Reviewed: 08/26/2011 [facility] Patient Information 2014 [place], [place].   Breastfeeding Deciding to breastfeed is one of the best choices you can make for you and your baby. A change in hormones during pregnancy causes  your breast tissue to grow and increases the number and size of your milk ducts. These hormones also allow proteins, sugars, and fats from your blood supply to make breast milk in your milk-producing glands. Hormones prevent breast milk from being released before your baby is born as well as prompt milk flow after birth. Once breastfeeding has begun, thoughts of your baby, as well as his or her sucking or crying, can stimulate the release of milk from your milk-producing glands.  BENEFITS OF BREASTFEEDING For Your Baby  Your first milk (colostrum) helps your baby's digestive  system function better.   There are antibodies in your milk that help your baby fight off infections.   Your baby has a lower incidence of asthma, allergies, and sudden infant death syndrome.   The nutrients in breast milk are better for your baby than infant formulas and are designed uniquely for your baby's needs.   Breast milk improves your baby's brain development.   Your baby is less likely to develop other conditions, such as childhood obesity, asthma, or type 2 diabetes mellitus.  For You   Breastfeeding helps to create a very special bond between you and your baby.   Breastfeeding is convenient. Breast milk is always available at the correct temperature and costs nothing.   Breastfeeding helps to burn calories and helps you lose the weight gained during pregnancy.   Breastfeeding makes your uterus contract to its prepregnancy size faster and slows bleeding (lochia) after you give birth.   Breastfeeding helps to lower your risk of developing type 2 diabetes mellitus, osteoporosis, and breast or ovarian cancer later in life. SIGNS THAT YOUR BABY IS HUNGRY Early Signs of Hunger  Increased alertness or activity.  Stretching.  Movement of the head from side to side.  Movement of the head and opening of the mouth when the corner of the mouth or cheek is stroked (rooting).  Increased sucking  sounds, smacking lips, cooing, sighing, or squeaking.  Hand-to-mouth movements.  Increased sucking of fingers or hands. Late Signs of Hunger  Fussing.  Intermittent crying. Extreme Signs of Hunger Signs of extreme hunger will require calming and consoling before your baby will be able to breastfeed successfully. Do not wait for the following signs of extreme hunger to occur before you initiate breastfeeding:   Restlessness.  A loud, strong cry.   Screaming.  BREASTFEEDING BASICS Breastfeeding Initiation  Find a comfortable place to sit or lie down, with your neck and back well supported.  Place a pillow or rolled up blanket under your baby to bring him or her to the level of your breast (if you are seated). Nursing pillows are specially designed to help support your arms and your baby while you breastfeed.  Make sure that your baby's abdomen is facing your abdomen.   Gently massage your breast. With your fingertips, massage from your chest wall toward your nipple in a circular motion. This encourages milk flow. You may need to continue this action during the feeding if your milk flows slowly.  Support your breast with 4 fingers underneath and your thumb above your nipple. Make sure your fingers are well away from your nipple and your baby's mouth.   Stroke your baby's lips gently with your finger or nipple.   When your baby's mouth is open wide enough, quickly bring your baby to your breast, placing your entire nipple and as much of the colored area around your nipple (areola) as possible into your baby's mouth.   More areola should be visible above your baby's upper lip than below the lower lip.   Your baby's tongue should be between his or her lower gum and your breast.   Ensure that your baby's mouth is correctly positioned around your nipple (latched). Your baby's lips should create a seal on your breast and be turned out (everted).  It is common for your baby to  suck about 2-3 minutes in order to start the flow of breast milk. Latching Teaching your baby how to latch on to your breast properly is very important. An  improper latch can cause nipple pain and decreased milk supply for you and poor weight gain in your baby. Also, if your baby is not latched onto your nipple properly, he or she may swallow some air during feeding. This can make your baby fussy. Burping your baby when you switch breasts during the feeding can help to get rid of the air. However, teaching your baby to latch on properly is still the best way to prevent fussiness from swallowing air while breastfeeding. Signs that your baby has successfully latched on to your nipple:    Silent tugging or silent sucking, without causing you pain.   Swallowing heard between every 3-4 sucks.    Muscle movement above and in front of his or her ears while sucking.  Signs that your baby has not successfully latched on to nipple:   Sucking sounds or smacking sounds from your baby while breastfeeding.  Nipple pain. If you think your baby has not latched on correctly, slip your finger into the corner of your baby's mouth to break the suction and place it between your baby's gums. Attempt breastfeeding initiation again. Signs of Successful Breastfeeding Signs from your baby:   A gradual decrease in the number of sucks or complete cessation of sucking.   Falling asleep.   Relaxation of his or her body.   Retention of a small amount of milk in his or her mouth.   Letting go of your breast by himself or herself. Signs from you:  Breasts that have increased in firmness, weight, and size 1-3 hours after feeding.   Breasts that are softer immediately after breastfeeding.  Increased milk volume, as well as a change in milk consistency and color by the fifth day of breastfeeding.   Nipples that are not sore, cracked, or bleeding. Signs That Your Pecola Leisure is Getting Enough Milk  Wetting at  least 3 diapers in a 24-hour period. The urine should be clear and pale yellow by age 67 days.  At least 3 stools in a 24-hour period by age 67 days. The stool should be soft and yellow.  At least 3 stools in a 24-hour period by age 84 days. The stool should be seedy and yellow.  No loss of weight greater than 10% of birth weight during the first 54 days of age.  Average weight gain of 4-7 ounces (113-198 g) per week after age 84 days.  Consistent daily weight gain by age 67 days, without weight loss after the age of 2 weeks. After a feeding, your baby may spit up a small amount. This is common. BREASTFEEDING FREQUENCY AND DURATION Frequent feeding will help you make more milk and can prevent sore nipples and breast engorgement. Breastfeed when you feel the need to reduce the fullness of your breasts or when your baby shows signs of hunger. This is called "breastfeeding on demand." Avoid introducing a pacifier to your baby while you are working to establish breastfeeding (the first 4-6 weeks after your baby is born). After this time you may choose to use a pacifier. Research has shown that pacifier use during the first year of a baby's life decreases the risk of sudden infant death syndrome (SIDS). Allow your baby to feed on each breast as long as he or she wants. Breastfeed until your baby is finished feeding. When your baby unlatches or falls asleep while feeding from the first breast, offer the second breast. Because newborns are often sleepy in the first few weeks of life, you  may need to awaken your baby to get him or her to feed. Breastfeeding times will vary from baby to baby. However, the following rules can serve as a guide to help you ensure that your baby is properly fed:  Newborns (babies 102 weeks of age or younger) may breastfeed every 1-3 hours.  Newborns should not go longer than 3 hours during the day or 5 hours during the night without breastfeeding.  You should breastfeed your baby a  minimum of 8 times in a 24-hour period until you begin to introduce solid foods to your baby at around 14 months of age. BREAST MILK PUMPING Pumping and storing breast milk allows you to ensure that your baby is exclusively fed your breast milk, even at times when you are unable to breastfeed. This is especially important if you are going back to work while you are still breastfeeding or when you are not able to be present during feedings. Your lactation consultant can give you guidelines on how long it is safe to store breast milk.  A breast pump is a machine that allows you to pump milk from your breast into a sterile bottle. The pumped breast milk can then be stored in a refrigerator or freezer. Some breast pumps are operated by hand, while others use electricity. Ask your lactation consultant which type will work best for you. Breast pumps can be purchased, but some hospitals and breastfeeding support groups lease breast pumps on a monthly basis. A lactation consultant can teach you how to hand express breast milk, if you prefer not to use a pump.  CARING FOR YOUR BREASTS WHILE YOU BREASTFEED Nipples can become dry, cracked, and sore while breastfeeding. The following recommendations can help keep your breasts moisturized and healthy:  Avoid using soap on your nipples.   Wear a supportive bra. Although not required, special nursing bras and tank tops are designed to allow access to your breasts for breastfeeding without taking off your entire bra or top. Avoid wearing underwire-style bras or extremely tight bras.  Air dry your nipples for 3-26minutes after each feeding.   Use only cotton bra pads to absorb leaked breast milk. Leaking of breast milk between feedings is normal.   Use lanolin on your nipples after breastfeeding. Lanolin helps to maintain your skin's normal moisture barrier. If you use pure lanolin, you do not need to wash it off before feeding your baby again. Pure lanolin is not  toxic to your baby. You may also hand express a few drops of breast milk and gently massage that milk into your nipples and allow the milk to air dry. In the first few weeks after giving birth, some women experience extremely full breasts (engorgement). Engorgement can make your breasts feel heavy, warm, and tender to the touch. Engorgement peaks within 3-5 days after you give birth. The following recommendations can help ease engorgement:  Completely empty your breasts while breastfeeding or pumping. You may want to start by applying warm, moist heat (in the shower or with warm water-soaked hand towels) just before feeding or pumping. This increases circulation and helps the milk flow. If your baby does not completely empty your breasts while breastfeeding, pump any extra milk after he or she is finished.  Wear a snug bra (nursing or regular) or tank top for 1-2 days to signal your body to slightly decrease milk production.  Apply ice packs to your breasts, unless this is too uncomfortable for you.  Make sure that your baby  is latched on and positioned properly while breastfeeding. If engorgement persists after 48 hours of following these recommendations, contact your health care provider or a Advertising copywriter. OVERALL HEALTH CARE RECOMMENDATIONS WHILE BREASTFEEDING  Eat healthy foods. Alternate between meals and snacks, eating 3 of each per day. Because what you eat affects your breast milk, some of the foods may make your baby more irritable than usual. Avoid eating these foods if you are sure that they are negatively affecting your baby.  Drink milk, fruit juice, and water to satisfy your thirst (about 10 glasses a day).   Rest often, relax, and continue to take your prenatal vitamins to prevent fatigue, stress, and anemia.  Continue breast self-awareness checks.  Avoid chewing and smoking tobacco.  Avoid alcohol and drug use. Some medicines that may be harmful to your baby can pass  through breast milk. It is important to ask your health care provider before taking any medicine, including all over-the-counter and prescription medicine as well as vitamin and herbal supplements. It is possible to become pregnant while breastfeeding. If birth control is desired, ask your health care provider about options that will be safe for your baby. SEEK MEDICAL CARE IF:   You feel like you want to stop breastfeeding or have become frustrated with breastfeeding.  You have painful breasts or nipples.  Your nipples are cracked or bleeding.  Your breasts are red, tender, or warm.  You have a swollen area on either breast.  You have a fever or chills.  You have nausea or vomiting.  You have drainage other than breast milk from your nipples.  Your breasts do not become full before feedings by the fifth day after you give birth.  You feel sad and depressed.  Your baby is too sleepy to eat well.  Your baby is having trouble sleeping.   Your baby is wetting less than 3 diapers in a 24-hour period.  Your baby has less than 3 stools in a 24-hour period.  Your baby's skin or the white part of his or her eyes becomes yellow.   Your baby is not gaining weight by 52 days of age. SEEK IMMEDIATE MEDICAL CARE IF:   Your baby is overly tired (lethargic) and does not want to wake up and feed.  Your baby develops an unexplained fever. Document Released: 10/20/2005 Document Revised: 10/25/2013 Document Reviewed: 04/13/2013 [facility] Patient Information 2015 [place], [place]. This information is not intended to replace advice given to you by your health care provider. Make sure you discuss any questions you have with your health care provider.

## 2014-10-13 NOTE — Progress Notes (Signed)
Care nurse called to report pt has tolerated her breakfast and lunch, she is passing gas but she is worried and would like to stay to tomorrow.   DC canceled and deferred until tomorrow

## 2014-10-13 NOTE — Plan of Care (Signed)
Problem: Discharge Progression Outcomes Goal: Tolerating diet Outcome: Completed/Met Date Met:  10/13/14 Goal: Pain controlled with appropriate interventions Outcome: Completed/Met Date Met:  10/13/14 Goal: Discharge plan in place and appropriate Outcome: Completed/Met Date Met:  10/13/14

## 2014-10-13 NOTE — Lactation Note (Signed)
This note was copied from the chart of Cheryl LoganJahniah Inglis. Lactation Consultation Note Mom has been BF well. Milk came in and became engorged during the night. RN set up DEBP and mom pumped breast and gave milk in bottle d/t breast pain. Assessed breast, pretty soft, a few tiny knots, massaged out. Mom has large everted nipples. Encouraged to massage breast during BF. Apply ICE and take anti-inflamatory medication as Dr. Lenna Gilfordrdered. Has on good supportive bra. Discussed engorgement prevention. Mom has a large amount of milk and large breast. Discussed mastitis prevention. Mom feels comfortable going home today. Patient Name: Cheryl LoganVaeda Kamm ZOXWR'UToday's Date: 10/13/2014 Reason for consult: Follow-up assessment (engorgement)   Maternal Data    Feeding Feeding Type: Bottle Fed - Breast Milk  LATCH Score/Interventions                      Lactation Tools Discussed/Used Tools: Pump Breast pump type: Double-Electric Breast Pump   Consult Status Consult Status: Complete Date: 10/13/14    Charyl DancerCARVER, Genevra Orne G 10/13/2014, 7:22 AM

## 2014-10-13 NOTE — Lactation Note (Addendum)
This note was copied from the chart of Cheryl Logan. Lactation Consultation Note  Patient Name: Cher[name]Skyra [place] Today's Date: 10/13/2014 Reason for consult: Follow-up assessment [facility] called Mom and [facility]. Mom has just pumped and received 5+ oz of breast milk. Baby asleep again at this visit, recently had bottle of breast milk. LC [facility] and advised that Mom reported baby had been nursing well. Ms. Clelia CroftShaw has limited pumps at this time and if baby is latching well Mom would not be eligible at this time for a pump. Advised Mom of conversation with Ms Clelia CroftShaw and encouraged Mom to start putting baby to breast with each feeding. Asked Mom if she would like assist with breastfeeding, she declined at this time. On exam, Mom's breasts are soft with few nodular areas noted. Advised Mom to massage these areas with BF or pumping. Reviewed supply and demand and reminded Mom that she either needs to BF or pump every 2-3 hours.  She has DEBP here and can use if she is tired and wants to pump/bottle for a few feedings. Mom reports understanding. Mom is not going home today.   Maternal Data    Feeding Feeding Type: Bottle Fed - Breast Milk  LATCH Score/Interventions                      Lactation Tools Discussed/Used Tools: Pump Breast pump type: Double-Electric Breast Pump WIC Program: Yes   Consult Status Consult Status: Follow-up Date: 10/13/14 Follow-up type: In-patient    Alfred LevinsGranger, Loy Mccartt Ann 10/13/2014, 3:24 PM

## 2014-10-14 MED ORDER — SENNOSIDES-DOCUSATE SODIUM 8.6-50 MG PO TABS: 2.0000 | ORAL_TABLET | ORAL | Status: DC

## 2014-10-14 NOTE — Discharge Summary (Signed)
Cheryl Logan ([place])Cheryl Logan  Postpartum Day 4 S/P Primary C/S for Fetal Bradycardia LOS: 5  Subjective - Patient reports readiness to be discharged.  States she had a bowel movement and has been passing gas.  Reports consumption of regular diet without issues of N/V.  Patient also reports breastfeeding going well and bleeding as light.  Patient states pain well controlled with percocet and ibuprofen.    Objective  Filed Vitals:   10/12/14 1815 10/13/14 0611 10/13/14 1713 10/14/14 0630  BP: 126/76 103/86 97/82 128/66  Pulse: 81 84 83 73  Temp: 98.1 F (36.7 C) 98.3 F (36.8 C) 98.6 F (37 C) 98.2 F (36.8 C)  TempSrc: Oral Oral Oral Oral  Resp: 18 18 18 18   Height:      Weight:      SpO2:        HEMOGLOBIN  Date/Time Value Ref Range Status  10/12/2014 11:55 AM 8.9* 12.0 - 15.0 g/dL Final  16/10/960412/07/2014 54:0905:45 AM 8.8* 12.0 - 15.0 g/dL Final    Comment:    DELTA CHECK NOTED REPEATED TO VERIFY    HCT  Date/Time Value Ref Range Status  10/12/2014 11:55 AM 27.1* 36.0 - 46.0 % Final  10/11/2014 05:45 AM 26.6* 36.0 - 46.0 % Final   PLATELETS  Date/Time Value Ref Range Status  10/12/2014 11:55 AM 244 150 - 400 K/uL Final  10/11/2014 05:45 AM 238 150 - 400 K/uL Final    Comment:    DELTA CHECK NOTED REPEATED TO VERIFY SPECIMEN CHECKED FOR CLOTS     Physical Exam  Constitutional: She is oriented to person, place, and time. She appears well-developed and well-nourished. No distress.  Cardiovascular: Normal rate, regular rhythm and normal heart sounds.   Pulmonary/Chest: Effort normal and breath sounds normal.  Nipples intact  Abdominal: Soft. Bowel sounds are normal. She exhibits no distension. There is tenderness.  Genitourinary: There is breast tenderness.  Musculoskeletal: Normal range of motion.  Neurological: She is alert and oriented to person, place, and time.  Skin: Skin is warm and dry.  Psychiatric: She has a normal mood and affect.   Assessment Postpartum Day 4 S/P  Primary C/S Hemodynamically Stable Breastfeeding Normal Involution  Plan -Discharge to home today -Follow up in office in 5 weeks -Wound care instructions, breastfeeding instructions, peri-care, PP Depression, and RX -Schedule for infant circumcision -Encouraged to call if any questions or concerns arise prior to next scheduled office visit.   Cheryl Logan Cheryl FifeLYNN, MSN, CNM 10/14/2014, 9:43 AM

## 2014-10-14 NOTE — Lactation Note (Signed)
This note was copied from the chart of Cheryl [name]Geraldyne Fichera. Lactation Consultation Note  Patient Name: Cheryl Logan Today's Date: 10/14/2014 Reason for consult: Follow-up assessment Per mom the baby just ate at 1000 form a bottle ( pumped breast milk )  Mom denies soreness or engorgement at this time. LC reviewed sore nipple and engorgement prevention and tx if needed. Per mom will have a manual pump at home. Mother informed of post-discharge support and given phone number to the lactation department, including services  for phone call assistance; out-patient appointments; and breastfeeding support group. List of other breastfeeding resources  in the community given in the handout. Encouraged mother to call for problems or concerns related to breastfeeding.  Maternal Data    Feeding Feeding Type: Bottle Fed - Breast Milk Length of feed: 20 min  LATCH Score/Interventions Latch: Grasps breast easily, tongue down, lips flanged, rhythmical sucking.  Audible Swallowing: Spontaneous and intermittent  Type of Nipple: Everted at rest and after stimulation  Comfort (Breast/Nipple): Soft / non-tender     Hold (Positioning): No assistance needed to correctly position infant at breast. Intervention(s): Breastfeeding basics reviewed  LATCH Score: 10  Lactation Tools Discussed/Used WIC Program: Yes   Consult Status Consult Status: Complete Date: 10/14/14    Kathrin Greathouseorio, Hashim Eichhorst Ann 10/14/2014, 10:33 AM

## 2014-10-14 NOTE — Discharge Instructions (Signed)
Breastfeeding Deciding to breastfeed is one of the best choices you can make for you and your baby. A change in hormones during pregnancy causes your breast tissue to grow and increases the number and size of your milk ducts. These hormones also allow proteins, sugars, and fats from your blood supply to make breast milk in your milk-producing glands. Hormones prevent breast milk from being released before your baby is born as well as prompt milk flow after birth. Once breastfeeding has begun, thoughts of your baby, as well as his or her sucking or crying, can stimulate the release of milk from your milk-producing glands.  BENEFITS OF BREASTFEEDING For Your Baby  Your first milk (colostrum) helps your baby's digestive system function better.   There are antibodies in your milk that help your baby fight off infections.   Your baby has a lower incidence of asthma, allergies, and sudden infant death syndrome.   The nutrients in breast milk are better for your baby than infant formulas and are designed uniquely for your baby's needs.   Breast milk improves your baby's brain development.   Your baby is less likely to develop other conditions, such as childhood obesity, asthma, or type 2 diabetes mellitus.  For You   Breastfeeding helps to create a very special bond between you and your baby.   Breastfeeding is convenient. Breast milk is always available at the correct temperature and costs nothing.   Breastfeeding helps to burn calories and helps you lose the weight gained during pregnancy.   Breastfeeding makes your uterus contract to its prepregnancy size faster and slows bleeding (lochia) after you give birth.   Breastfeeding helps to lower your risk of developing type 2 diabetes mellitus, osteoporosis, and breast or ovarian cancer later in life. SIGNS THAT YOUR BABY IS HUNGRY Early Signs of Hunger  Increased alertness or activity.  Stretching.  Movement of the head from  side to side.  Movement of the head and opening of the mouth when the corner of the mouth or cheek is stroked (rooting).  Increased sucking sounds, smacking lips, cooing, sighing, or squeaking.  Hand-to-mouth movements.  Increased sucking of fingers or hands. Late Signs of Hunger  Fussing.  Intermittent crying. Extreme Signs of Hunger Signs of extreme hunger will require calming and consoling before your baby will be able to breastfeed successfully. Do not wait for the following signs of extreme hunger to occur before you initiate breastfeeding:   Restlessness.  A loud, strong cry.   Screaming. BREASTFEEDING BASICS Breastfeeding Initiation  Find a comfortable place to sit or lie down, with your neck and back well supported.  Place a pillow or rolled up blanket under your baby to bring him or her to the level of your breast (if you are seated). Nursing pillows are specially designed to help support your arms and your baby while you breastfeed.  Make sure that your baby's abdomen is facing your abdomen.   Gently massage your breast. With your fingertips, massage from your chest wall toward your nipple in a circular motion. This encourages milk flow. You may need to continue this action during the feeding if your milk flows slowly.  Support your breast with 4 fingers underneath and your thumb above your nipple. Make sure your fingers are well away from your nipple and your baby's mouth.   Stroke your baby's lips gently with your finger or nipple.   When your baby's mouth is open wide enough, quickly bring your baby to your  breast, placing your entire nipple and as much of the colored area around your nipple (areola) as possible into your baby's mouth.   More areola should be visible above your baby's upper lip than below the lower lip.   Your baby's tongue should be between his or her lower gum and your breast.   Ensure that your baby's mouth is correctly positioned  around your nipple (latched). Your baby's lips should create a seal on your breast and be turned out (everted).  It is common for your baby to suck about 2-3 minutes in order to start the flow of breast milk. Latching Teaching your baby how to latch on to your breast properly is very important. An improper latch can cause nipple pain and decreased milk supply for you and poor weight gain in your baby. Also, if your baby is not latched onto your nipple properly, he or she may swallow some air during feeding. This can make your baby fussy. Burping your baby when you switch breasts during the feeding can help to get rid of the air. However, teaching your baby to latch on properly is still the best way to prevent fussiness from swallowing air while breastfeeding. Signs that your baby has successfully latched on to your nipple:    Silent tugging or silent sucking, without causing you pain.   Swallowing heard between every 3-4 sucks.    Muscle movement above and in front of his or her ears while sucking.  Signs that your baby has not successfully latched on to nipple:   Sucking sounds or smacking sounds from your baby while breastfeeding.  Nipple pain. If you think your baby has not latched on correctly, slip your finger into the corner of your baby's mouth to break the suction and place it between your baby's gums. Attempt breastfeeding initiation again. Signs of Successful Breastfeeding Signs from your baby:   A gradual decrease in the number of sucks or complete cessation of sucking.   Falling asleep.   Relaxation of his or her body.   Retention of a small amount of milk in his or her mouth.   Letting go of your breast by himself or herself. Signs from you:  Breasts that have increased in firmness, weight, and size 1-3 hours after feeding.   Breasts that are softer immediately after breastfeeding.  Increased milk volume, as well as a change in milk consistency and color by  the fifth day of breastfeeding.   Nipples that are not sore, cracked, or bleeding. Signs That Your Pecola Leisure is Getting Enough Milk  Wetting at least 3 diapers in a 24-hour period. The urine should be clear and pale yellow by age 96 days.  At least 3 stools in a 24-hour period by age 96 days. The stool should be soft and yellow.  At least 3 stools in a 24-hour period by age 21 days. The stool should be seedy and yellow.  No loss of weight greater than 10% of birth weight during the first 53 days of age.  Average weight gain of 4-7 ounces (113-198 g) per week after age 36 days.  Consistent daily weight gain by age 96 days, without weight loss after the age of 2 weeks. After a feeding, your baby may spit up a small amount. This is common. BREASTFEEDING FREQUENCY AND DURATION Frequent feeding will help you make more milk and can prevent sore nipples and breast engorgement. Breastfeed when you feel the need to reduce the fullness of your breasts  or when your baby shows signs of hunger. This is called "breastfeeding on demand." Avoid introducing a pacifier to your baby while you are working to establish breastfeeding (the first 4-6 weeks after your baby is born). After this time you may choose to use a pacifier. Research has shown that pacifier use during the first year of a baby's life decreases the risk of sudden infant death syndrome (SIDS). Allow your baby to feed on each breast as long as he or she wants. Breastfeed until your baby is finished feeding. When your baby unlatches or falls asleep while feeding from the first breast, offer the second breast. Because newborns are often sleepy in the first few weeks of life, you may need to awaken your baby to get him or her to feed. Breastfeeding times will vary from baby to baby. However, the following rules can serve as a guide to help you ensure that your baby is properly fed:  Newborns (babies 59 weeks of age or younger) may breastfeed every 1-3  hours.  Newborns should not go longer than 3 hours during the day or 5 hours during the night without breastfeeding.  You should breastfeed your baby a minimum of 8 times in a 24-hour period until you begin to introduce solid foods to your baby at around 11 months of age. BREAST MILK PUMPING Pumping and storing breast milk allows you to ensure that your baby is exclusively fed your breast milk, even at times when you are unable to breastfeed. This is especially important if you are going back to work while you are still breastfeeding or when you are not able to be present during feedings. Your lactation consultant can give you guidelines on how long it is safe to store breast milk.  A breast pump is a machine that allows you to pump milk from your breast into a sterile bottle. The pumped breast milk can then be stored in a refrigerator or freezer. Some breast pumps are operated by hand, while others use electricity. Ask your lactation consultant which type will work best for you. Breast pumps can be purchased, but some hospitals and breastfeeding support groups lease breast pumps on a monthly basis. A lactation consultant can teach you how to hand express breast milk, if you prefer not to use a pump.  CARING FOR YOUR BREASTS WHILE YOU BREASTFEED Nipples can become dry, cracked, and sore while breastfeeding. The following recommendations can help keep your breasts moisturized and healthy:  Avoid using soap on your nipples.   Wear a supportive bra. Although not required, special nursing bras and tank tops are designed to allow access to your breasts for breastfeeding without taking off your entire bra or top. Avoid wearing underwire-style bras or extremely tight bras.  Air dry your nipples for 3-65minutes after each feeding.   Use only cotton bra pads to absorb leaked breast milk. Leaking of breast milk between feedings is normal.   Use lanolin on your nipples after breastfeeding. Lanolin helps to  maintain your skin's normal moisture barrier. If you use pure lanolin, you do not need to wash it off before feeding your baby again. Pure lanolin is not toxic to your baby. You may also hand express a few drops of breast milk and gently massage that milk into your nipples and allow the milk to air dry. In the first few weeks after giving birth, some women experience extremely full breasts (engorgement). Engorgement can make your breasts feel heavy, warm, and tender to the  touch. Engorgement peaks within 3-5 days after you give birth. The following recommendations can help ease engorgement:  Completely empty your breasts while breastfeeding or pumping. You may want to start by applying warm, moist heat (in the shower or with warm water-soaked hand towels) just before feeding or pumping. This increases circulation and helps the milk flow. If your baby does not completely empty your breasts while breastfeeding, pump any extra milk after he or she is finished.  Wear a snug bra (nursing or regular) or tank top for 1-2 days to signal your body to slightly decrease milk production.  Apply ice packs to your breasts, unless this is too uncomfortable for you.  Make sure that your baby is latched on and positioned properly while breastfeeding. If engorgement persists after 48 hours of following these recommendations, contact your health care provider or a Advertising copywriterlactation consultant. OVERALL HEALTH CARE RECOMMENDATIONS WHILE BREASTFEEDING  Eat healthy foods. Alternate between meals and snacks, eating 3 of each per day. Because what you eat affects your breast milk, some of the foods may make your baby more irritable than usual. Avoid eating these foods if you are sure that they are negatively affecting your baby.  Drink milk, fruit juice, and water to satisfy your thirst (about 10 glasses a day).   Rest often, relax, and continue to take your prenatal vitamins to prevent fatigue, stress, and anemia.  Continue  breast self-awareness checks.  Avoid chewing and smoking tobacco.  Avoid alcohol and drug use. Some medicines that may be harmful to your baby can pass through breast milk. It is important to ask your health care provider before taking any medicine, including all over-the-counter and prescription medicine as well as vitamin and herbal supplements. It is possible to become pregnant while breastfeeding. If birth control is desired, ask your health care provider about options that will be safe for your baby. SEEK MEDICAL CARE IF:   You feel like you want to stop breastfeeding or have become frustrated with breastfeeding.  You have painful breasts or nipples.  Your nipples are cracked or bleeding.  Your breasts are red, tender, or warm.  You have a swollen area on either breast.  You have a fever or chills.  You have nausea or vomiting.  You have drainage other than breast milk from your nipples.  Your breasts do not become full before feedings by the fifth day after you give birth.  You feel sad and depressed.  Your baby is too sleepy to eat well.  Your baby is having trouble sleeping.   Your baby is wetting less than 3 diapers in a 24-hour period.  Your baby has less than 3 stools in a 24-hour period.  Your baby's skin or the white part of his or her eyes becomes yellow.   Your baby is not gaining weight by 385 days of age. SEEK IMMEDIATE MEDICAL CARE IF:   Your baby is overly tired (lethargic) and does not want to wake up and feed.  Your baby develops an unexplained fever. Document Released: 10/20/2005 Document Revised: 10/25/2013 Document Reviewed: 04/13/2013 [facility] Patient Information 2015 [place], [place]. This information is not intended to replace advice given to you by your health care provider. Make sure you discuss any questions you have with your health care provider. Postpartum Depression and Baby Blues The postpartum period begins right after the birth of a  baby. During this time, there is often a great amount of joy and excitement. It is also a time  of many changes in the life of the parents. Regardless of how many times a mother gives birth, each child brings new challenges and dynamics to the family. It is not unusual to have feelings of excitement along with confusing shifts in moods, emotions, and thoughts. All mothers are at risk of developing postpartum depression or the "baby blues." These mood changes can occur right after giving birth, or they may occur many months after giving birth. The baby blues or postpartum depression can be mild or severe. Additionally, postpartum depression can go away rather quickly, or it can be a long-term condition.  CAUSES Raised hormone levels and the rapid drop in those levels are thought to be a main cause of postpartum depression and the baby blues. A number of hormones change during and after pregnancy. Estrogen and progesterone usually decrease right after the delivery of your baby. The levels of thyroid hormone and various cortisol steroids also rapidly drop. Other factors that play a role in these mood changes include major life events and genetics.  RISK FACTORS If you have any of the following risks for the baby blues or postpartum depression, know what symptoms to watch out for during the postpartum period. Risk factors that may increase the likelihood of getting the baby blues or postpartum depression include:  Having a personal or family history of depression.   Having depression while being pregnant.   Having premenstrual mood issues or mood issues related to oral contraceptives.  Having a lot of life stress.   Having marital conflict.   Lacking a social support network.   Having a baby with special needs.   Having health problems, such as diabetes.  SIGNS AND SYMPTOMS Symptoms of baby blues include:  Brief changes in mood, such as going from extreme happiness to sadness.  Decreased  concentration.   Difficulty sleeping.   Crying spells, tearfulness.   Irritability.   Anxiety.  Symptoms of postpartum depression typically begin within the first month after giving birth. These symptoms include:  Difficulty sleeping or excessive sleepiness.   Marked weight loss.   Agitation.   Feelings of worthlessness.   Lack of interest in activity or food.  Postpartum psychosis is a very serious condition and can be dangerous. Fortunately, it is rare. Displaying any of the following symptoms is cause for immediate medical attention. Symptoms of postpartum psychosis include:   Hallucinations and delusions.   Bizarre or disorganized behavior.   Confusion or disorientation.  DIAGNOSIS  A diagnosis is made by an evaluation of your symptoms. There are no medical or lab tests that lead to a diagnosis, but there are various questionnaires that a health care provider may use to identify those with the baby blues, postpartum depression, or psychosis. Often, a screening tool called the [place] Postnatal Depression Scale is used to diagnose depression in the postpartum period.  TREATMENT The baby blues usually goes away on its own in 1-2 weeks. Social support is often all that is needed. You will be encouraged to get adequate sleep and rest. Occasionally, you may be given medicines to help you sleep.  Postpartum depression requires treatment because it can last several months or longer if it is not treated. Treatment may include individual or group therapy, medicine, or both to address any social, physiological, and psychological factors that may play a role in the depression. Regular exercise, a healthy diet, rest, and social support may also be strongly recommended.  Postpartum psychosis is more serious and needs treatment right away.  Hospitalization is often needed. HOME CARE INSTRUCTIONS  Get as much rest as you can. Nap when the baby sleeps.   Exercise regularly.  Some women find yoga and walking to be beneficial.   Eat a balanced and nourishing diet.   Do little things that you enjoy. Have a cup of tea, take a bubble bath, read your favorite magazine, or listen to your favorite music.  Avoid alcohol.   Ask for help with household chores, cooking, grocery shopping, or running errands as needed. Do not try to do everything.   Talk to people close to you about how you are feeling. Get support from your partner, family members, friends, or other new moms.  Try to stay positive in how you think. Think about the things you are grateful for.   Do not spend a lot of time alone.   Only take over-the-counter or prescription medicine as directed by your health care provider.  Keep all your postpartum appointments.   Let your health care provider know if you have any concerns.  SEEK MEDICAL CARE IF: You are having a reaction to or problems with your medicine. SEEK IMMEDIATE MEDICAL CARE IF:  You have suicidal feelings.   You think you may harm the baby or someone else. MAKE SURE YOU:  Understand these instructions.  Will watch your condition.  Will get help right away if you are not doing well or get worse. Document Released: 07/24/2004 Document Revised: 10/25/2013 Document Reviewed: 08/01/2013 [facility] LPExitCare Patient Information 2015 [place], [place]. This information is not intended to replace advice given to you by your health care provider. Make sure you discuss any questions you have with your health care provider. Etonogestrel implant What is this medicine? ETONOGESTREL (et oh noe JES trel) is a contraceptive (birth control) device. It is used to prevent pregnancy. It can be used for up to 3 years. This medicine may be used for other purposes; ask your health care provider or pharmacist if you have questions. COMMON BRAND NAME(S): Implanon, Nexplanon What should I tell my health care provider before I take this medicine? They need to  know if you have any of these conditions: -abnormal vaginal bleeding -blood vessel disease or blood clots -cancer of the breast, cervix, or liver -depression -diabetes -gallbladder disease -headaches -heart disease or recent heart attack -high blood pressure -high cholesterol -kidney disease -liver disease -renal disease -seizures -tobacco smoker -an unusual or allergic reaction to etonogestrel, other hormones, anesthetics or antiseptics, medicines, foods, dyes, or preservatives -pregnant or trying to get pregnant -breast-feeding How should I use this medicine? This device is inserted just under the skin on the inner side of your upper arm by a health care professional. Talk to your pediatrician regarding the use of this medicine in children. Special care may be needed. Overdosage: If you think you've taken too much of this medicine contact a poison control center or emergency room at once. Overdosage: If you think you have taken too much of this medicine contact a poison control center or emergency room at once. NOTE: This medicine is only for you. Do not share this medicine with others. What if I miss a dose? This does not apply. What may interact with this medicine? Do not take this medicine with any of the following medications: -amprenavir -bosentan -fosamprenavir This medicine may also interact with the following medications: -barbiturate medicines for inducing sleep or treating seizures -certain medicines for fungal infections like ketoconazole and itraconazole -griseofulvin -medicines to treat seizures like carbamazepine,  felbamate, oxcarbazepine, phenytoin, topiramate -modafinil -phenylbutazone -rifampin -some medicines to treat HIV infection like atazanavir, indinavir, lopinavir, nelfinavir, tipranavir, ritonavir -St. John's wort This list may not describe all possible interactions. Give your health care provider a list of all the medicines, herbs, non-prescription  drugs, or dietary supplements you use. Also tell them if you smoke, drink alcohol, or use illegal drugs. Some items may interact with your medicine. What should I watch for while using this medicine? This product does not protect you against HIV infection (AIDS) or other sexually transmitted diseases. You should be able to feel the implant by pressing your fingertips over the skin where it was inserted. Tell your doctor if you cannot feel the implant. What side effects may I notice from receiving this medicine? Side effects that you should report to your doctor or health care professional as soon as possible: -allergic reactions like skin rash, itching or hives, swelling of the face, lips, or tongue -breast lumps -changes in vision -confusion, trouble speaking or understanding -dark urine -depressed mood -general ill feeling or flu-like symptoms -light-colored stools -loss of appetite, nausea -right upper belly pain -severe headaches -severe pain, swelling, or tenderness in the abdomen -shortness of breath, chest pain, swelling in a leg -signs of pregnancy -sudden numbness or weakness of the face, arm or leg -trouble walking, dizziness, loss of balance or coordination -unusual vaginal bleeding, discharge -unusually weak or tired -yellowing of the eyes or skin Side effects that usually do not require medical attention (Report these to your doctor or health care professional if they continue or are bothersome.): -acne -breast pain -changes in weight -cough    Iron-Rich Diet  An iron-rich diet contains foods that are good sources of iron. Iron is an important mineral that helps your body produce hemoglobin. Hemoglobin is a protein in red blood cells that carries oxygen to the body's tissues. Sometimes, the iron level in your blood can be low. This may be caused by:  A lack of iron in your diet.  Blood loss.  Times of growth, such as during pregnancy or during a child's growth and  development. Low levels of iron can cause a decrease in the number of red blood cells. This can result in iron deficiency anemia. Iron deficiency anemia symptoms include:  Tiredness.  Weakness.  Irritability.  Increased chance of infection. Here are some recommendations for daily iron intake:  Males older than 19 years of age need 8 mg of iron per day.  Women ages 86 to 39 need 18 mg of iron per day.  Pregnant women need 27 mg of iron per day, and women who are over 49 years of age and breastfeeding need 9 mg of iron per day.  Women over the age of 68 need 8 mg of iron per day. SOURCES OF IRON There are 2 types of iron that are found in food: heme iron and nonheme iron. Heme iron is absorbed by the body better than nonheme iron. Heme iron is found in meat, poultry, and fish. Nonheme iron is found in grains, beans, and vegetables. Heme Iron Sources Food / Iron (mg)  Chicken liver, 3 oz (85 g)/ 10 mg  Beef liver, 3 oz (85 g)/ 5.5 mg  Oysters, 3 oz (85 g)/ 8 mg  Beef, 3 oz (85 g)/ 2 to 3 mg  Shrimp, 3 oz (85 g)/ 2.8 mg  [place], 3 oz (85 g)/ 2 mg  Chicken, 3 oz (85 g) / 1 mg  Fish (tuna, halibut),  3 oz (85 g)/ 1 mg  Pork, 3 oz (85 g)/ 0.9 mg Nonheme Iron Sources Food / Iron (mg)  Ready-to-eat breakfast cereal, iron-fortified / 3.9 to 7 mg  Tofu,  cup / 3.4 mg  Kidney beans,  cup / 2.6 mg  Baked potato with skin / 2.7 mg  Asparagus,  cup / 2.2 mg  Avocado / 2 mg  Dried peaches,  cup / 1.6 mg  Raisins,  cup / 1.5 mg  Soy milk, 1 cup / 1.5 mg  Whole-wheat bread, 1 slice / 1.2 mg  Spinach, 1 cup / 0.8 mg  Broccoli,  cup / 0.6 mg IRON ABSORPTION Certain foods can decrease the body's absorption of iron. Try to avoid these foods and beverages while eating meals with iron-containing foods:  Coffee.  Tea.  Fiber.  Soy. Foods containing vitamin C can help increase the amount of iron your body absorbs from iron sources, especially from nonheme  sources. Eat foods with vitamin C along with iron-containing foods to increase your iron absorption. Foods that are high in vitamin C include many fruits and vegetables. Some good sources are:  Fresh orange juice.  Oranges.  Strawberries.  Mangoes.  Grapefruit.  Red bell peppers.  Green bell peppers.  Broccoli.  Potatoes with skin.  Tomato juice. Document Released: 06/03/2005 Document Revised: 01/12/2012 Document Reviewed: 04/10/2011 [facility] Patient Information 2015 [place], [place]. This information is not intended to replace advice given to you by your health care provider. Make sure you discuss any questions you have with your health care provider.  -fever or chills -headache -irregular menstrual bleeding -itching, burning, and vaginal discharge -pain or difficulty passing urine -sore throat This list may not describe all possible side effects. Call your doctor for medical advice about side effects. You may report side effects to FDA at 1-800-FDA-1088. Where should I keep my medicine? This drug is given in a hospital or clinic and will not be stored at home. NOTE: This sheet is a summary. It may not cover all possible information. If you have questions about this medicine, talk to your doctor, pharmacist, or health care provider.  2015, Elsevier/Gold Standard. (2012-04-26 15:37:45)

## 2015-05-03 IMAGING — CT CT ABD-PELV W/ CM
1 of 3 series · 13 of 32 positions shown, 19 images · IV contrast (OMNIPAQUE)
Comparison: None.

CLINICAL DATA: Generalized abdominal pain. Dilated bowel loops.
Postop from Caesarean section.

EXAM:
CT ABDOMEN AND PELVIS WITH CONTRAST
TECHNIQUE: Multidetector CT imaging of the abdomen and pelvis was performed
using the standard protocol following bolus administration of
intravenous contrast.
CONTRAST:  100mL OMNIPAQUE IOHEXOL 300 MG/ML  SOLN

[Series 2: routine abdomen/pelvis with · axial · 0.70mm/px · z∈[+588,+954]mm · 13 of 85 slices shown, 19 images]
[im 6/85  soft-tissue]
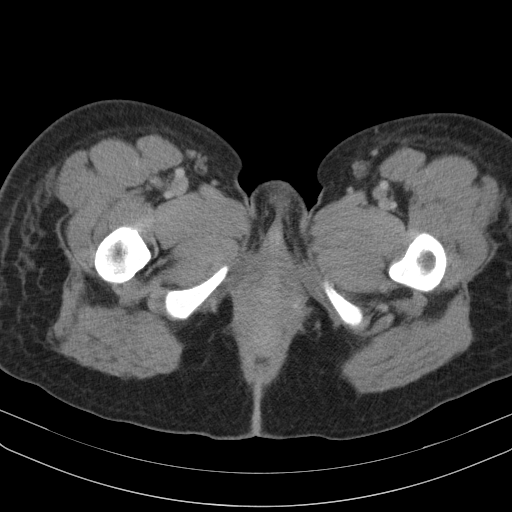
[im 6/85  bone]
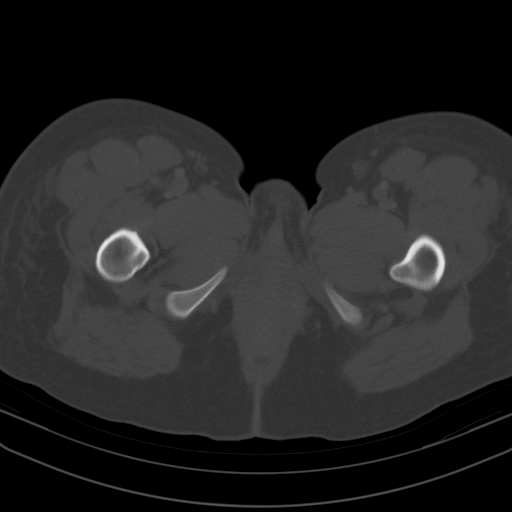
[im 12/85  soft-tissue]
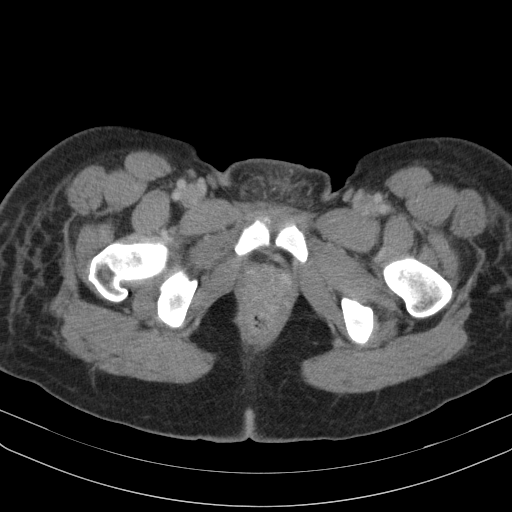
[im 17/85  soft-tissue]
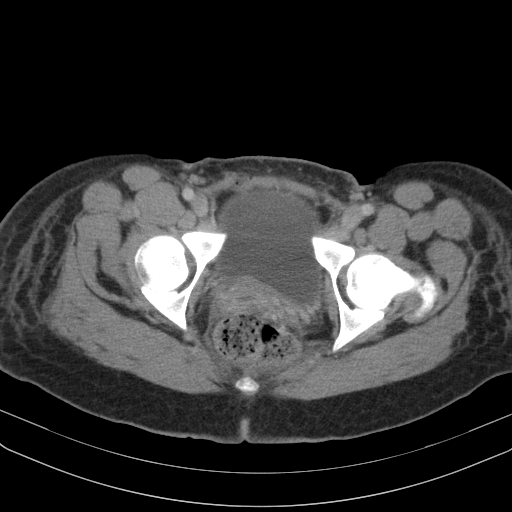
[im 23/85  soft-tissue]
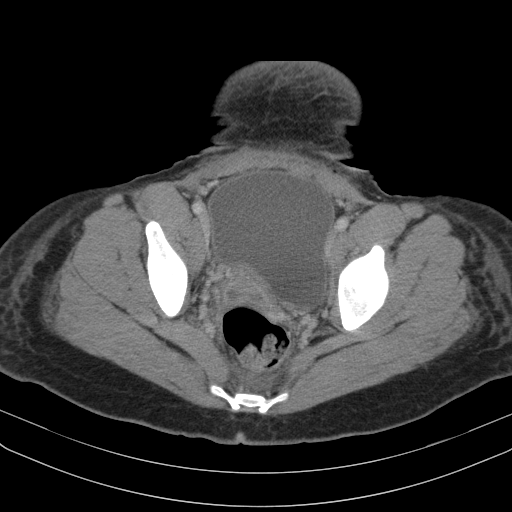
[im 29/85  soft-tissue]
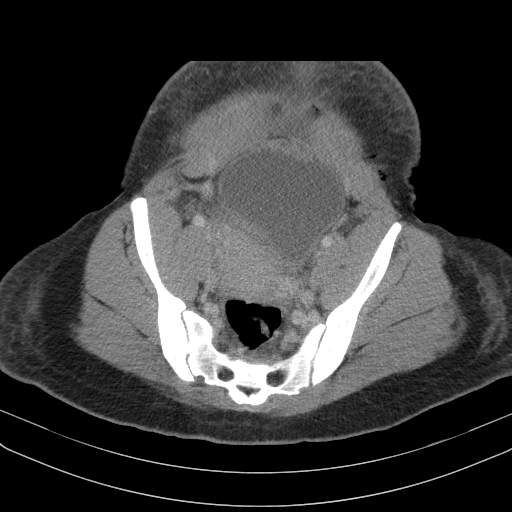
[im 34/85  soft-tissue]
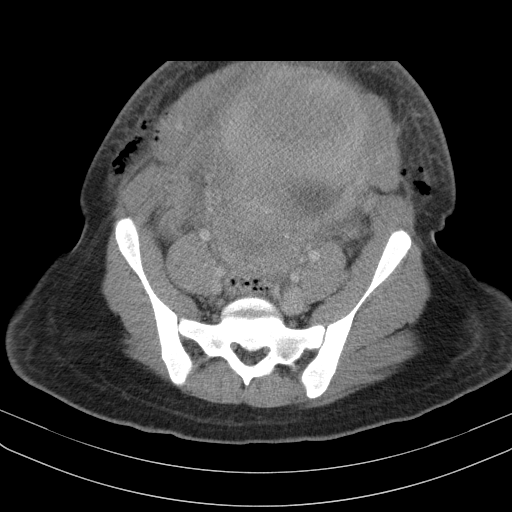
[im 45/85  soft-tissue]
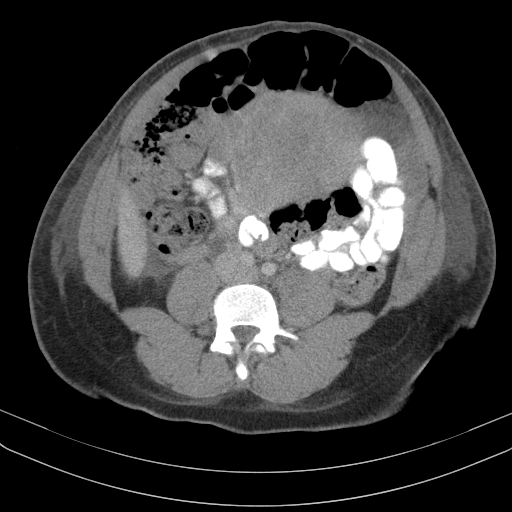
[im 51/85  soft-tissue]
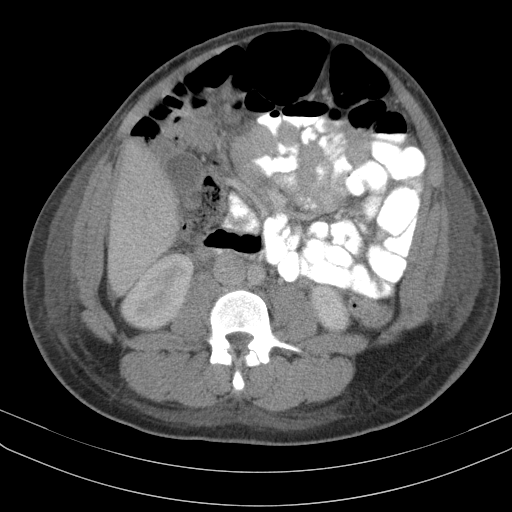
[im 57/85  soft-tissue]
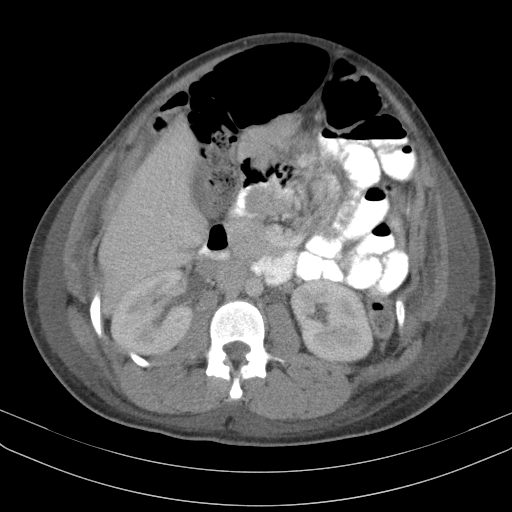
[im 57/85  bone]
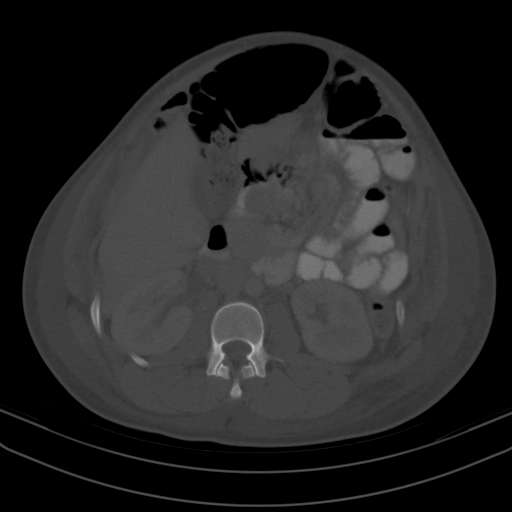
[im 62/85  soft-tissue]
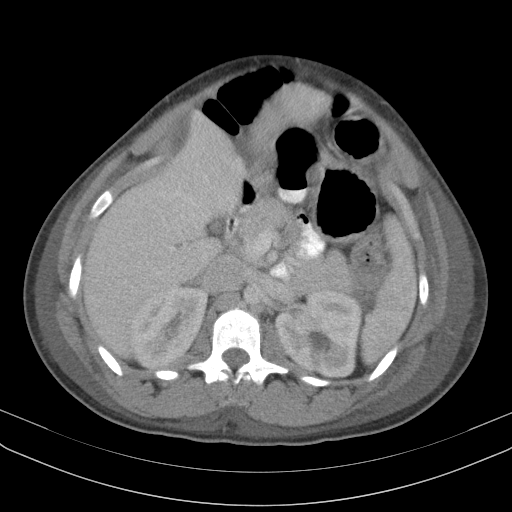
[im 62/85  lung]
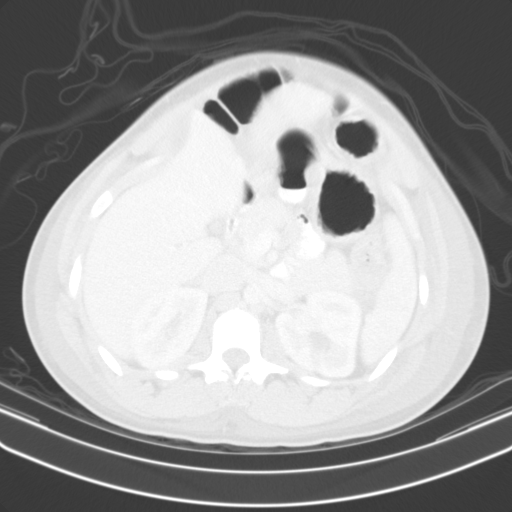
[im 68/85  soft-tissue]
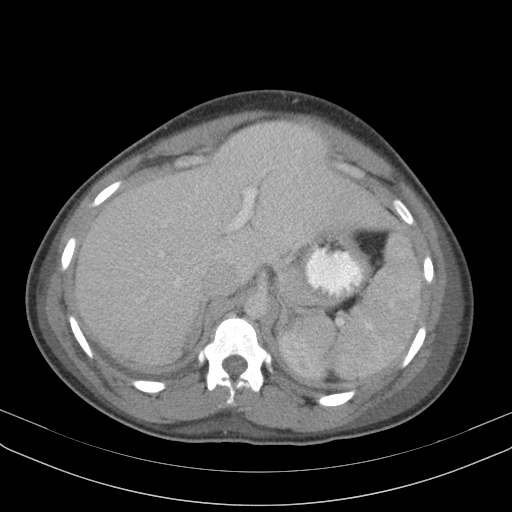
[im 68/85  lung]
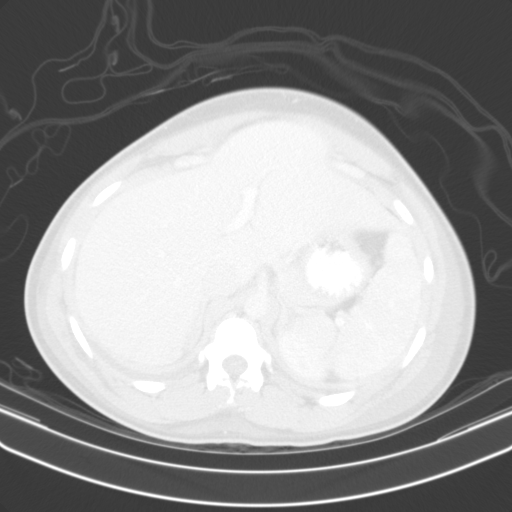
[im 73/85  soft-tissue]
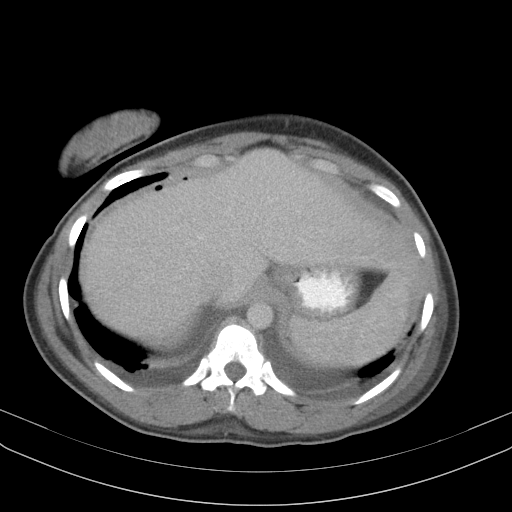
[im 73/85  lung]
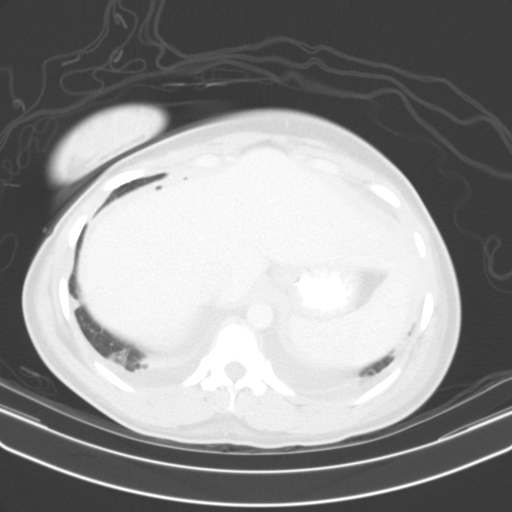
[im 79/85  soft-tissue]
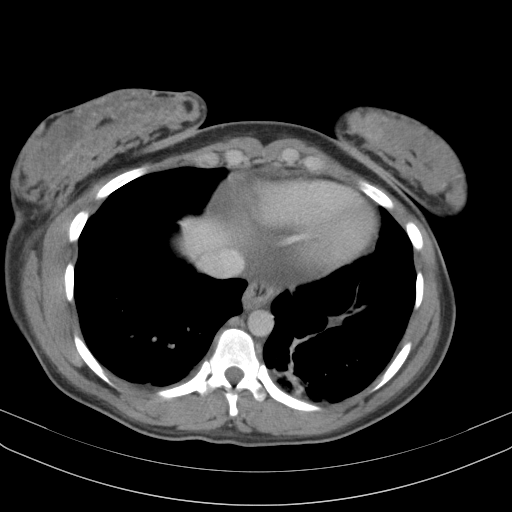
[im 79/85  lung]
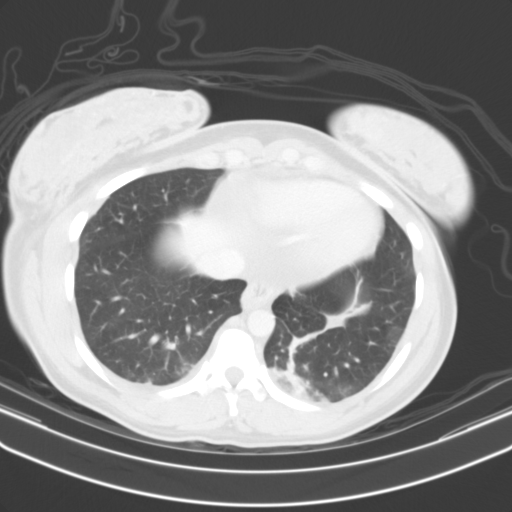

[13 of 32 positions shown; findings below may reference images not displayed]

FINDINGS: Lower Chest: Tiny bilateral pleural effusions and bibasilar
atelectasis noted. Diffuse body wall edema also noted.

Hepatobiliary: No masses or other significant abnormality
identified.

Pancreas: No mass, inflammatory changes, or other parenchymal
abnormality identified.

Spleen:  Within normal limits in size and appearance.

Adrenal Glands:  No mass identified.

Kidneys/Urinary Tract: No masses identified. Symmetric mild
hydronephrosis of pregnancy noted bilaterally.

Stomach/Bowel/Peritoneum: No evidence of small bowel dilatation.
Gaseous distention of nondependent colon is seen, consistent with
postop ileus. Tiny amount of postop free air noted.

Vascular/Lymphatic: No pathologically enlarged lymph nodes
identified. No other significant abnormality identified.

Reproductive: Enlarged postpartum uterus demonstrated expected
postoperative changes from recent cesarean section noted within the
suprapubic abdominal wall.

Other:  None.

Musculoskeletal:  No suspicious bone lesions identified.
IMPRESSION: Expected postop changes from recent C-section with small amount of
postop free air and mild colonic ileus.

No evidence of postop hematoma, abscess, or bowel obstruction.

Mild symmetric bilateral hydronephrosis of pregnancy. No evidence of
ureteral injury.

Small bilateral pleural effusions, bibasilar atelectasis, and
diffuse body wall edema.

## 2015-12-08 ENCOUNTER — Emergency Department (HOSPITAL_BASED_OUTPATIENT_CLINIC_OR_DEPARTMENT_OTHER)
Admission: EM | Admit: 2015-12-08 | Discharge: 2015-12-08 | Disposition: A | Discharge status: Home / Self Care | Payer: BLUE CROSS/BLUE SHIELD | Attending: Physician Assistant | Admitting: Physician Assistant

## 2015-12-08 ENCOUNTER — Encounter (HOSPITAL_BASED_OUTPATIENT_CLINIC_OR_DEPARTMENT_OTHER): Payer: Self-pay

## 2015-12-08 DIAGNOSIS — J4520 Mild intermittent asthma, uncomplicated: Secondary | ICD-10-CM | POA: Diagnosis not present

## 2015-12-08 DIAGNOSIS — R05 Cough: Secondary | ICD-10-CM | POA: Diagnosis present

## 2015-12-08 DIAGNOSIS — Z872 Personal history of diseases of the skin and subcutaneous tissue: Secondary | ICD-10-CM | POA: Insufficient documentation

## 2015-12-08 DIAGNOSIS — Z8739 Personal history of other diseases of the musculoskeletal system and connective tissue: Secondary | ICD-10-CM | POA: Diagnosis not present

## 2015-12-08 DIAGNOSIS — Z8719 Personal history of other diseases of the digestive system: Secondary | ICD-10-CM | POA: Insufficient documentation

## 2015-12-08 DIAGNOSIS — Z8619 Personal history of other infectious and parasitic diseases: Secondary | ICD-10-CM | POA: Insufficient documentation

## 2015-12-08 DIAGNOSIS — Z79899 Other long term (current) drug therapy: Secondary | ICD-10-CM | POA: Insufficient documentation

## 2015-12-08 MED ORDER — ALBUTEROL SULFATE HFA 108 (90 BASE) MCG/ACT IN AERS: 1.0000 | INHALATION_SPRAY | RESPIRATORY_TRACT | Status: DC | PRN

## 2015-12-08 MED ORDER — ALBUTEROL SULFATE HFA 108 (90 BASE) MCG/ACT IN AERS
2.0000 | INHALATION_SPRAY | Freq: Once | RESPIRATORY_TRACT | Status: AC
  Administered 2015-12-08: 2 via RESPIRATORY_TRACT
  Filled 2015-12-08: qty 6.7

## 2015-12-08 MED ORDER — AEROCHAMBER PLUS W/MASK MISC
1.0000 | Freq: Once | Status: AC
  Administered 2015-12-08: 1
  Filled 2015-12-08: qty 1

## 2015-12-08 MED ORDER — ALBUTEROL SULFATE (2.5 MG/3ML) 0.083% IN NEBU
5.0000 mg | INHALATION_SOLUTION | Freq: Once | RESPIRATORY_TRACT | Status: AC
  Administered 2015-12-08: 5 mg via RESPIRATORY_TRACT
  Filled 2015-12-08: qty 6

## 2015-12-08 NOTE — Discharge Instructions (Signed)
Please return if you have any trouble breathing or fever or worsenign cough.  Asthma, Adult Asthma is a condition of the lungs in which the airways tighten and narrow. Asthma can make it hard to breathe. Asthma cannot be cured, but medicine and lifestyle changes can help control it. Asthma may be started (triggered) by:  Animal skin flakes (dander).  Dust.  Cockroaches.  Pollen.  Mold.  Smoke.  Cleaning products.  Hair sprays or aerosol sprays.  Paint fumes or strong smells.  Cold air, weather changes, and winds.  Crying or laughing hard.  Stress.  Certain medicines or drugs.  Foods, such as dried fruit, potato chips, and sparkling grape juice.  Infections or conditions (colds, flu).  Exercise.  Certain medical conditions or diseases.  Exercise or tiring activities. HOME CARE   Take medicine as told by your doctor.  Use a peak flow meter as told by your doctor. A peak flow meter is a tool that measures how well the lungs are working.  Record and keep track of the peak flow meter's readings.  Understand and use the asthma action plan. An asthma action plan is a written plan for taking care of your asthma and treating your attacks.  To help prevent asthma attacks:  Do not smoke. Stay away from secondhand smoke.  Change your heating and air conditioning filter often.  Limit your use of fireplaces and wood stoves.  Get rid of pests (such as roaches and mice) and their droppings.  Throw away plants if you see mold on them.  Clean your floors. Dust regularly. Use cleaning products that do not smell.  Have someone vacuum when you are not home. Use a vacuum cleaner with a HEPA filter if possible.  Replace carpet with wood, tile, or vinyl flooring. Carpet can trap animal skin flakes and dust.  Use allergy-proof pillows, mattress covers, and box spring covers.  Wash bed sheets and blankets every week in hot water and dry them in a dryer.  Use blankets  that are made of polyester or cotton.  Clean bathrooms and kitchens with bleach. If possible, have someone repaint the walls in these rooms with mold-resistant paint. Keep out of the rooms that are being cleaned and painted.  Wash hands often. GET HELP IF:  You have make a whistling sound when breaking (wheeze), have shortness of breath, or have a cough even if taking medicine to prevent attacks.  The colored mucus you cough up (sputum) is thicker than usual.  The colored mucus you cough up changes from clear or white to yellow, green, gray, or bloody.  You have problems from the medicine you are taking such as:  A rash.  Itching.  Swelling.  Trouble breathing.  You need reliever medicines more than 2-3 times a week.  Your peak flow measurement is still at 50-79% of your personal best after following the action plan for 1 hour.  You have a fever. GET HELP RIGHT AWAY IF:   You seem to be worse and are not responding to medicine during an asthma attack.  You are short of breath even at rest.  You get short of breath when doing very little activity.  You have trouble eating, drinking, or talking.  You have chest pain.  You have a fast heartbeat.  Your lips or fingernails start to turn blue.  You are light-headed, dizzy, or faint.  Your peak flow is less than 50% of your personal best.   This information is not  intended to replace advice given to you by your health care provider. Make sure you discuss any questions you have with your health care provider.   Document Released: 04/07/2008 Document Revised: 07/11/2015 Document Reviewed: 05/19/2013 Elsevier Interactive Patient Education [employer]

## 2015-12-08 NOTE — ED Notes (Signed)
Patient here with cough and congestion. Wants a refill on her albuterol inhaler because she is out. Talking full sentences, no distress

## 2015-12-08 NOTE — ED Provider Notes (Signed)
CSN: [id number]     Arrival date & time 12/08/15  1504 History  By signing my name below, I, Freida Busman, attest that this documentation has been prepared under the direction and in the presence of Julisa Flippo Randall An, MD . Electronically Signed: Freida Busman, Scribe. 12/08/2015. 3:45 PM.     Chief Complaint  Patient presents with  . Cough  . Asthma   The history is provided by the patient. No language interpreter was used.   HPI Comments:  Cheryl Logan is a 20 y.o. female with a history of asthma, who presents to the Emergency Department complaining of persistent cough. She notes episode today is similar to past asthma exacerbations but notes her inhaler was empty. She reports associated nasal congestion x a few days. No alleviating factors noted.   Past Medical History  Diagnosis Date  . Eczema   . Pyelonephritis complicating pregnancy, antepartum 07/17/2014  . Arthritis   . HSV (herpes simplex virus) infection     history of-no current lesions  . GERD (gastroesophageal reflux disease)   . Chlamydia   . Asthma    Past Surgical History  Procedure Laterality Date  . No past surgeries    . Cesarean section N/A 10/10/2014    Procedure: CESAREAN SECTION;  Surgeon: Purcell Nails, MD;  Location: [facility];  Service: Obstetrics;  Laterality: N/A;   No family history on file. Social History  Substance Use Topics  . Smoking status: Never Smoker   . Smokeless tobacco: None  . Alcohol Use: No   OB History    Gravida Para Term Preterm AB TAB SAB Ectopic Multiple Living   0 1     Review of Systems  10 systems reviewed and all are negative for acute change except as noted in the HPI.   Allergies  Shellfish allergy; Banana; Fish oil; and Peanut-containing drug products  Home Medications   Prior to Admission medications   Medication Sig Start Date End Date Taking? Authorizing Provider  albuterol (PROVENTIL HFA;VENTOLIN HFA) 108 (90 BASE) MCG/ACT inhaler  Inhale 2 puffs into the lungs every 6 (six) hours as needed for wheezing or shortness of breath.    Historical Provider, MD   BP 93/82 mmHg  Pulse 90  Temp(Src) 98.7 F (37.1 C) (Oral)  Resp 18  Ht  (1.575 m)  Wt 143 lb (64.864 kg)  BMI 26.15 kg/m2  SpO2 100%  LMP 12/04/2015  Breastfeeding? No Physical Exam  Constitutional: She is oriented to person, place, and time. She appears well-developed and well-nourished. No distress.  HENT:  Head: Normocephalic and atraumatic.  Eyes: Conjunctivae are normal.  Cardiovascular: Normal rate, regular rhythm and normal heart sounds.   Pulmonary/Chest: Effort normal and breath sounds normal. No respiratory distress. She has no wheezes.  Abdominal: She exhibits no distension.  Neurological: She is alert and oriented to person, place, and time.  Skin: Skin is warm and dry.  Psychiatric: She has a normal mood and affect.  Nursing note and vitals reviewed.   ED Course  Procedures   DIAGNOSTIC STUDIES:  Oxygen Saturation is 100% on RA, normal by my interpretation.    COORDINATION OF CARE:  3:32 PM Pt states she feels better after breathing treatment given in ED.  Will discharge with albuterol inhaler. Discussed treatment plan with pt at bedside and pt agreed to plan.    MDM   Final diagnoses:  Asthma, mild intermittent, uncomplicated  Patient is a very pleasant 20 year old female presenting with asthma attack. Patient reports that she was feeling like she had her usual symptoms of asthma but then had no inhaler left. She came here got one treatment feels much improved. Her lungs are clear now. She is satting 100% on room air.  She has no stigmata of infection, no fever. She's had mild nasal congestion for the last couple days. Doubt pneumonia given patient's well appearance.  Patient not having need to treat for asthma exacerbation with prednisone, it was just that she did not have her albuterol home. (she has never had to have  pred in the past)  We will give her albuterol here as well as a prescription home with.  I personally performed the services described in this documentation, which was scribed in my presence. The recorded information has been reviewed and is accurate.     Calden Dorsey Randall An, MD 12/08/15 1556

## 2016-05-02 ENCOUNTER — Encounter (HOSPITAL_COMMUNITY): Payer: Self-pay

## 2016-05-02 ENCOUNTER — Inpatient Hospital Stay (HOSPITAL_COMMUNITY)
Admission: AD | Admit: 2016-05-02 | Discharge: 2016-05-02 | Disposition: A | Discharge status: Home / Self Care | Payer: BLUE CROSS/BLUE SHIELD | Source: Ambulatory Visit | Attending: Obstetrics & Gynecology | Admitting: Obstetrics & Gynecology

## 2016-05-02 ENCOUNTER — Inpatient Hospital Stay (HOSPITAL_COMMUNITY): Payer: BLUE CROSS/BLUE SHIELD

## 2016-05-02 DIAGNOSIS — R110 Nausea: Secondary | ICD-10-CM | POA: Diagnosis not present

## 2016-05-02 DIAGNOSIS — J45909 Unspecified asthma, uncomplicated: Secondary | ICD-10-CM | POA: Diagnosis not present

## 2016-05-02 DIAGNOSIS — L309 Dermatitis, unspecified: Secondary | ICD-10-CM | POA: Insufficient documentation

## 2016-05-02 DIAGNOSIS — O26891 Other specified pregnancy related conditions, first trimester: Secondary | ICD-10-CM | POA: Diagnosis not present

## 2016-05-02 DIAGNOSIS — Z3A20 20 weeks gestation of pregnancy: Secondary | ICD-10-CM | POA: Diagnosis not present

## 2016-05-02 DIAGNOSIS — N939 Abnormal uterine and vaginal bleeding, unspecified: Secondary | ICD-10-CM | POA: Diagnosis present

## 2016-05-02 DIAGNOSIS — K219 Gastro-esophageal reflux disease without esophagitis: Secondary | ICD-10-CM | POA: Diagnosis not present

## 2016-05-02 DIAGNOSIS — O99512 Diseases of the respiratory system complicating pregnancy, second trimester: Secondary | ICD-10-CM | POA: Diagnosis not present

## 2016-05-02 DIAGNOSIS — O209 Hemorrhage in early pregnancy, unspecified: Secondary | ICD-10-CM | POA: Diagnosis not present

## 2016-05-02 DIAGNOSIS — O99612 Diseases of the digestive system complicating pregnancy, second trimester: Secondary | ICD-10-CM | POA: Diagnosis not present

## 2016-05-02 LAB — URINE MICROSCOPIC-ADD ON: WBC, UA: NONE SEEN WBC/hpf (ref 0–5)

## 2016-05-02 LAB — CBC
HCT: 33.1 % — ABNORMAL LOW (ref 36.0–46.0)
Hemoglobin: 11.3 g/dL — ABNORMAL LOW (ref 12.0–15.0)
MCH: 27 pg (ref 26.0–34.0)
MCHC: 34.1 g/dL (ref 30.0–36.0)
MCV: 79 fL (ref 78.0–100.0)
Platelets: 259 10*3/uL (ref 150–400)
RBC: 4.19 MIL/uL (ref 3.87–5.11)
RDW: 13.6 % (ref 11.5–15.5)
WBC: 9.1 10*3/uL (ref 4.0–10.5)

## 2016-05-02 LAB — OB RESULTS CONSOLE ABO/RH: RH Type: POSITIVE

## 2016-05-02 LAB — URINALYSIS, ROUTINE W REFLEX MICROSCOPIC
Bilirubin Urine: NEGATIVE
Glucose, UA: NEGATIVE mg/dL
Ketones, ur: NEGATIVE mg/dL
Leukocytes, UA: NEGATIVE
Nitrite: NEGATIVE
Protein, ur: NEGATIVE mg/dL
Specific Gravity, Urine: >1.03 — ABNORMAL HIGH (ref 1.005–1.030)
pH: 6 (ref 5.0–8.0)

## 2016-05-02 LAB — OB RESULTS CONSOLE ANTIBODY SCREEN: Antibody Screen: NEGATIVE

## 2016-05-02 LAB — OB RESULTS CONSOLE HEPATITIS B SURFACE ANTIGEN: Hepatitis B Surface Ag: NEGATIVE

## 2016-05-02 LAB — OB RESULTS CONSOLE GC/CHLAMYDIA
Chlamydia: POSITIVE
Gonorrhea: NEGATIVE

## 2016-05-02 LAB — OB RESULTS CONSOLE RUBELLA ANTIBODY, IGM: Rubella: UNDETERMINED

## 2016-05-02 LAB — OB RESULTS CONSOLE HIV ANTIBODY (ROUTINE TESTING): HIV: NONREACTIVE

## 2016-05-02 LAB — HCG, QUANTITATIVE, PREGNANCY: hCG, Beta Chain, Quant, S: 84740 m[IU]/mL — ABNORMAL HIGH (ref <5)

## 2016-05-02 LAB — POCT PREGNANCY, URINE: Preg Test, Ur: POSITIVE — AB

## 2016-05-02 LAB — OB RESULTS CONSOLE RPR: RPR: NONREACTIVE

## 2016-05-02 MED ORDER — PROMETHAZINE HCL 25 MG PO TABS: 25.0000 mg | ORAL_TABLET | Freq: Once | ORAL | Status: DC

## 2016-05-02 NOTE — MAU Note (Signed)
Pt states that she is having some vaginal bleeding-was like a period, now when wiping. Denies pain. +nausea. Had a +upt in the office the third week in May. Has an appointment in the office tomorrow.

## 2016-05-02 NOTE — MAU Provider Note (Signed)
History     Patient Active Problem List   Diagnosis Date Noted  . Status post primary low transverse cesarean section 10/12/2014  . Normal labor 10/09/2014  . 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 Multiple food allergies--banana, fish oil, peanut 10/09/2014  . Asthma, chronic 10/09/2014  . Eczema 10/09/2014  . GBS (group B streptococcus) UTI complicating pregnancy--07/16/14 08/07/2014  . Short cervix 07/18/2014  . Pyelonephritis complicating pregnancy, antepartum 07/17/2014    Chief Complaint  Patient presents with  . Vaginal Bleeding  . Possible Pregnancy   HPI Cheryl Logan 20 y.o. G2P1001  presents to the MAU stating that she had a positive pregnancy test 1 month ago and is still having periods and experiencing nausea. Pt has not had any care in this pregnancy so far. OB History    Gravida Para Term Preterm AB TAB SAB Ectopic Multiple Living   2 1 1       0 1      Past Medical History  Diagnosis Date  . Eczema   . Pyelonephritis complicating pregnancy, antepartum 07/17/2014  . Arthritis   . HSV (herpes simplex virus) infection     history of-no current lesions  . GERD (gastroesophageal reflux disease)   . Chlamydia   . Asthma     Past Surgical History  Procedure Laterality Date  . No past surgeries    . Cesarean section N/A 10/10/2014    Procedure: CESAREAN SECTION;  Surgeon: Purcell NailsAngela Y Roberts, MD;  Location: [facility];  Service: Obstetrics;  Laterality: N/A;  . Hernia repair      History reviewed. No pertinent family history.  Social History  Substance Use Topics  . Smoking status: Never Smoker   . Smokeless tobacco: None  . Alcohol Use: No    Allergies:  Allergies  Allergen Reactions  . Shellfish Allergy Anaphylaxis  . Banana   . Fish Oil   . Peanut-Containing Drug Products Swelling    Prescriptions prior to admission  Medication Sig Dispense Refill Last Dose  . albuterol (PROVENTIL HFA;VENTOLIN HFA) 108 (90 BASE) MCG/ACT inhaler Inhale 2 puffs into the  lungs every 6 (six) hours as needed for wheezing or shortness of breath.   Past Week at Unknown time  . albuterol (PROVENTIL HFA;VENTOLIN HFA) 108 (90 Base) MCG/ACT inhaler Inhale 1-2 puffs into the lungs every 4 (four) hours as needed for wheezing or shortness of breath. 1 Inhaler 0     ROS Review of Systems - Negative except nausea and vaginal bleeding Physical Exam   Blood pressure 81/61, pulse 72, temperature 97.5 F (36.4 C), temperature source Oral, resp. rate 16, height 5\' 2"  (1.575 m), weight 145 lb (65.772 kg), last menstrual period 03/09/2016, SpO2 99 %, not currently breastfeeding.    Physical Exam Physical Examination: Physical Examination: General appearance - alert, well appearing, and in no distress Mental status - alert, oriented to person, place, and time Heart - normal rate, regular rhythm, normal S1, S2, no murmurs, rubs, clicks or gallops Abdomen - soft, nontender, nondistended, no masses or organomegaly Neurological - alert, oriented, normal speech, no focal findings or movement disorder noted Musculoskeletal - no joint tenderness, deformity or swelling Skin - normal coloration and turgor, no rashes, no suspicious skin lesions noted ED Course   Results for orders placed or performed during the hospital encounter of 05/02/16 (from the past 24 hour(s))  Urinalysis, Routine w reflex microscopic ([facility])     Status: Abnormal   Collection Time: 05/02/16  1:06 AM  Result Value Ref Range   Color, Urine YELLOW YELLOW   APPearance CLEAR CLEAR   Specific Gravity, Urine >1.030 (H) 1.005 - 1.030   pH 6.0 5.0 - 8.0   Glucose, UA NEGATIVE NEGATIVE mg/dL   Hgb urine dipstick SMALL (A) NEGATIVE   Bilirubin Urine NEGATIVE NEGATIVE   Ketones, ur NEGATIVE NEGATIVE mg/dL   Protein, ur NEGATIVE NEGATIVE mg/dL   Nitrite NEGATIVE NEGATIVE   Leukocytes, UA NEGATIVE NEGATIVE  Urine microscopic-add on     Status: Abnormal   Collection Time: 05/02/16  1:06 AM  Result Value Ref  Range   Squamous Epithelial / LPF 0-5 (A) NONE SEEN   WBC, UA NONE SEEN 0 - 5 WBC/hpf   RBC / HPF 0-5 0 - 5 RBC/hpf   Bacteria, UA RARE (A) NONE SEEN   Urine-Other AMORPHOUS URATES/PHOSPHATES   Pregnancy, urine POC     Status: Abnormal   Collection Time: 05/02/16  1:14 AM  Result Value Ref Range   Preg Test, Ur POSITIVE (A) NEGATIVE  CBC     Status: Abnormal   Collection Time: 05/02/16  2:08 AM  Result Value Ref Range   WBC 9.1 4.0 - 10.5 K/uL   RBC 4.19 3.87 - 5.11 MIL/uL   Hemoglobin 11.3 (L) 12.0 - 15.0 g/dL   HCT 40.933.1 (L) 81.136.0 - 91.446.0 %   MCV 79.0 78.0 - 100.0 fL   MCH 27.0 26.0 - 34.0 pg   MCHC 34.1 30.0 - 36.0 g/dL   RDW 78.213.6 95.611.5 - 21.315.5 %   Platelets 259 150 - 400 K/uL  hCG, quantitative, pregnancy     Status: Abnormal   Collection Time: 05/02/16  2:08 AM  Result Value Ref Range   hCG, Beta Chain, Quant, S 84740 (H) <5 mIU/mL  [place] Ob Comp Less 14 Wks  05/02/2016  CLINICAL DATA:  Spotting and cramps EXAM: OBSTETRIC <14 WK ULTRASOUND TECHNIQUE: Transabdominal ultrasound was performed for evaluation of the gestation as well as the maternal uterus and adnexal regions. COMPARISON:  None. FINDINGS: Intrauterine gestational sac: Single Yolk sac: Embryo:  Yes Cardiac Activity: Yes Heart Rate: 164 bpm MSD:   mm    w     d CRL:   62.5  mm   12 w 5 d                  US EDC: 11/09/2016 Subchorionic hemorrhage:  None visualized. Maternal uterus/adnexae: Normal right ovary. Nonvisualization of the left ovary. No free pelvic fluid IMPRESSION: Single living intrauterine gestation measuring 12 weeks 5 days by crown-rump length. Electronically Signed   By: Ellery Plunkaniel R Mitchell M.D.   On: 05/02/2016 03:45     Assessment:  IUP @ 12+5  Vaginal bleeding Nausea  Plan: Phenergan 25 mg #30 PNV Start prenatal Care; Has appointment tomorrow at 845am  Cheryl Logan CNM, MSN 05/02/2016 4:11 AM

## 2016-05-02 NOTE — Discharge Instructions (Signed)

## 2016-05-02 NOTE — MAU Note (Signed)
Notified provider that patient is here.   

## 2016-08-01 ENCOUNTER — Inpatient Hospital Stay (HOSPITAL_COMMUNITY)
Admission: AD | Admit: 2016-08-01 | Payer: BLUE CROSS/BLUE SHIELD | Source: Ambulatory Visit | Admitting: Obstetrics & Gynecology

## 2016-08-15 ENCOUNTER — Inpatient Hospital Stay (HOSPITAL_COMMUNITY)
Admission: AD | Admit: 2016-08-15 | Discharge: 2016-08-15 | Disposition: A | Discharge status: Home / Self Care | Payer: BLUE CROSS/BLUE SHIELD | Source: Ambulatory Visit | Attending: Obstetrics and Gynecology | Admitting: Obstetrics and Gynecology

## 2016-08-15 ENCOUNTER — Encounter (HOSPITAL_COMMUNITY): Payer: Self-pay | Admitting: Certified Nurse Midwife

## 2016-08-15 DIAGNOSIS — Z91010 Allergy to peanuts: Secondary | ICD-10-CM | POA: Diagnosis not present

## 2016-08-15 DIAGNOSIS — Z91018 Allergy to other foods: Secondary | ICD-10-CM | POA: Insufficient documentation

## 2016-08-15 DIAGNOSIS — N898 Other specified noninflammatory disorders of vagina: Secondary | ICD-10-CM | POA: Diagnosis not present

## 2016-08-15 DIAGNOSIS — O26893 Other specified pregnancy related conditions, third trimester: Secondary | ICD-10-CM | POA: Insufficient documentation

## 2016-08-15 DIAGNOSIS — O99612 Diseases of the digestive system complicating pregnancy, second trimester: Secondary | ICD-10-CM | POA: Diagnosis not present

## 2016-08-15 DIAGNOSIS — Z8619 Personal history of other infectious and parasitic diseases: Secondary | ICD-10-CM | POA: Diagnosis not present

## 2016-08-15 DIAGNOSIS — Z3A27 27 weeks gestation of pregnancy: Secondary | ICD-10-CM | POA: Diagnosis not present

## 2016-08-15 DIAGNOSIS — Z0371 Encounter for suspected problem with amniotic cavity and membrane ruled out: Secondary | ICD-10-CM

## 2016-08-15 DIAGNOSIS — Z91013 Allergy to seafood: Secondary | ICD-10-CM | POA: Diagnosis not present

## 2016-08-15 LAB — WET PREP, GENITAL
Sperm: NONE SEEN
Trich, Wet Prep: NONE SEEN
Yeast Wet Prep HPF POC: NONE SEEN

## 2016-08-15 LAB — AMNISURE RUPTURE OF MEMBRANE (ROM) NOT AT ARMC: Amnisure ROM: NEGATIVE

## 2016-08-15 MED ORDER — LACTATED RINGERS IV BOLUS (SEPSIS): 1000.0000 mL | Freq: Once | INTRAVENOUS | Status: DC

## 2016-08-15 MED ORDER — NIFEDIPINE 10 MG PO CAPS
20.0000 mg | ORAL_CAPSULE | Freq: Once | ORAL | Status: AC
  Administered 2016-08-15: 20 mg via ORAL
  Filled 2016-08-15: qty 2

## 2016-08-15 NOTE — MAU Provider Note (Signed)
History     CSN: [id number]  Arrival date and time: 08/15/16 1640   First Provider Initiated Contact with Patient 08/15/16 1715      Chief Complaint  Patient presents with  . possible rupture of membranes   [name] is a 20 y.o. G2P1001 at 5822w5d who presents today with leaking of fluid. She states that she had a large gush around1250, and she has continued to leak since. She denies any abdominal pain or contractions. She denies any VB. She states that the baby has been moving normally. She reports that the fluid had a "yellow tint". She denies any complications thus far with the pregnancy or any complications with her last pregnancy. She did have a c-section with her prior pregnancy.    Vaginal Discharge  The patient's primary symptoms include vaginal discharge. This is a new problem. The current episode started today. The problem occurs intermittently. The problem has been unchanged. The patient is experiencing no pain. She is pregnant. Pertinent negatives include no abdominal pain, chills, dysuria, fever, frequency, nausea, urgency or vomiting. The vaginal discharge was malodorous, watery and yellow. There has been no bleeding. Nothing aggravates the symptoms. She has tried nothing for the symptoms.   Past Medical History:  Diagnosis Date  . Arthritis   . Asthma   . Chlamydia   . Eczema   . GERD (gastroesophageal reflux disease)   . HSV (herpes simplex virus) infection    history of-no current lesions  . Pyelonephritis complicating pregnancy, antepartum 07/17/2014    Past Surgical History:  Procedure Laterality Date  . CESAREAN SECTION N/A 10/10/2014   Procedure: CESAREAN SECTION;  Surgeon: Purcell NailsAngela Y Roberts, MD;  Location: [facility];  Service: Obstetrics;  Laterality: N/A;  . HERNIA REPAIR    . NO PAST SURGERIES      History reviewed. No pertinent family history.  Social History  Substance Use Topics  . Smoking status: Never Smoker  . Smokeless tobacco: Never Used   . Alcohol use No    Allergies:  Allergies  Allergen Reactions  . Shellfish Allergy Anaphylaxis  . Banana   . Fish Oil   . Peanut-Containing Drug Products Swelling    Prescriptions Prior to Admission  Medication Sig Dispense Refill Last Dose  . albuterol (PROVENTIL HFA;VENTOLIN HFA) 108 (90 BASE) MCG/ACT inhaler Inhale 2 puffs into the lungs every 6 (six) hours as needed for wheezing or shortness of breath.   Past Week at Unknown time  . albuterol (PROVENTIL HFA;VENTOLIN HFA) 108 (90 Base) MCG/ACT inhaler Inhale 1-2 puffs into the lungs every 4 (four) hours as needed for wheezing or shortness of breath. 1 Inhaler 0   . promethazine (PHENERGAN) 25 MG tablet Take 1 tablet (25 mg total) by mouth once. 30 tablet 0     Review of Systems  Constitutional: Negative for chills and fever.  Gastrointestinal: Negative for abdominal pain, nausea and vomiting.  Genitourinary: Positive for vaginal discharge. Negative for dysuria, frequency and urgency.   Physical Exam   Blood pressure 109/55, pulse 98, temperature 97.8 F (36.6 C), temperature source Axillary, resp. rate 18, weight 155 lb 6.4 oz (70.5 kg), last menstrual period 03/09/2016, not currently breastfeeding.  Physical Exam  Nursing note and vitals reviewed. Constitutional: She is oriented to person, place, and time. She appears well-developed and well-nourished. No distress.  HENT:  Head: Normocephalic.  Cardiovascular: Normal rate.   Respiratory: Effort normal.  GI: Soft. There is no tenderness. There is no rebound.  Genitourinary:  Genitourinary  Comments:  External: no lesion Vagina: small amount of frothy discharge  Cervix: pink, smooth, visually closed  Uterus: AGA  Neurological: She is alert and oriented to person, place, and time.  Skin: Skin is warm and dry.  Psychiatric: She has a normal mood and affect.   FHT 145, moderate with 15x15 accels, no decels Toco about q 4-5 mins, patient denies any pain.   Results for  orders placed or performed during the hospital encounter of 08/15/16 (from the past 24 hour(s))  Wet prep, genital     Status: Abnormal   Collection Time: 08/15/16  5:24 PM  Result Value Ref Range   Yeast Wet Prep HPF POC NONE SEEN NONE SEEN   Trich, Wet Prep NONE SEEN NONE SEEN   Clue Cells Wet Prep HPF POC PRESENT (A) NONE SEEN   WBC, Wet Prep HPF POC MODERATE (A) NONE SEEN   Sperm NONE SEEN   Amnisure rupture of membrane (rom)not at [facility]     Status: None   Collection Time: 08/15/16  5:24 PM  Result Value Ref Range   Amnisure ROM NEGATIVE    MAU Course  Procedures  MDM Patient has had one dose of procardia. No more contractions tracing on the monitor. Patient denies any pain. Cervix: closed/thick/ballotable.  1800 left message with Dr. Estanislado Pandy.  1832: D/W Dr. Estanislado Pandy, ok for DC home.   Assessment and Plan   1. Encounter for suspected premature rupture of membranes, with rupture of membranes not found   2. [redacted] weeks gestation of pregnancy    DC home Comfort measures reviewed  3rd Trimester precautions  PTL precautions  Fetal kick counts RX: none  Return to MAU as needed FU with OB as planned  Follow-up Information    [facility] .   Specialty:  Obstetrics and Gynecology Contact information: [address] [place] [place] [address] [phone]           Tawnya Crook 08/15/2016, 5:16 PM

## 2016-08-15 NOTE — MAU Note (Signed)
Was at work, felt like she peed on herself.  Went to bathroom, underwear was soaking wet. Changed underwear and put on a pad, has continued to leak. No bleeding. No pain.

## 2016-08-15 NOTE — Discharge Instructions (Signed)

## 2016-08-15 NOTE — MAU Note (Signed)
Urine sent to lab 

## 2016-08-18 LAB — GC/CHLAMYDIA PROBE AMP (~~LOC~~) NOT AT ARMC
Chlamydia: NEGATIVE
Neisseria Gonorrhea: NEGATIVE

## 2016-09-05 ENCOUNTER — Encounter (HOSPITAL_BASED_OUTPATIENT_CLINIC_OR_DEPARTMENT_OTHER): Payer: Self-pay | Admitting: *Deleted

## 2016-09-05 ENCOUNTER — Emergency Department (HOSPITAL_BASED_OUTPATIENT_CLINIC_OR_DEPARTMENT_OTHER)
Admission: EM | Admit: 2016-09-05 | Discharge: 2016-09-05 | Disposition: A | Discharge status: Home / Self Care | Payer: BLUE CROSS/BLUE SHIELD | Attending: Emergency Medicine | Admitting: Emergency Medicine

## 2016-09-05 DIAGNOSIS — Z79899 Other long term (current) drug therapy: Secondary | ICD-10-CM | POA: Insufficient documentation

## 2016-09-05 DIAGNOSIS — Z91010 Allergy to peanuts: Secondary | ICD-10-CM | POA: Insufficient documentation

## 2016-09-05 DIAGNOSIS — Z3A30 30 weeks gestation of pregnancy: Secondary | ICD-10-CM | POA: Insufficient documentation

## 2016-09-05 DIAGNOSIS — R0602 Shortness of breath: Secondary | ICD-10-CM | POA: Diagnosis present

## 2016-09-05 DIAGNOSIS — J45901 Unspecified asthma with (acute) exacerbation: Secondary | ICD-10-CM

## 2016-09-05 DIAGNOSIS — O99513 Diseases of the respiratory system complicating pregnancy, third trimester: Secondary | ICD-10-CM | POA: Insufficient documentation

## 2016-09-05 MED ORDER — ALBUTEROL SULFATE HFA 108 (90 BASE) MCG/ACT IN AERS: 2.0000 | INHALATION_SPRAY | Freq: Four times a day (QID) | RESPIRATORY_TRACT | 0 refills | Status: AC | PRN

## 2016-09-05 MED ORDER — ALBUTEROL SULFATE HFA 108 (90 BASE) MCG/ACT IN AERS
2.0000 | INHALATION_SPRAY | Freq: Once | RESPIRATORY_TRACT | Status: AC
  Administered 2016-09-05: 2 via RESPIRATORY_TRACT
  Filled 2016-09-05: qty 6.7

## 2016-09-05 NOTE — ED Provider Notes (Signed)
[facility] Provider Note   CSN: [id number] Arrival date & time: 09/05/16  [id number]     History   Chief Complaint Chief Complaint  Patient presents with  . Shortness of Breath    HPI [place]Nataly Cindra Logan is a 20 y.o. female who is [redacted] weeks pregnant and has a history of asthma, eczema, and GERD presents with increase in albuterol use due to wheezing and shortness of breath. Patient reports that for the last week, she has had to use her albuterol inhaler every few hours. She says that she has not had any fevers, chills, chest pain, productive cough, nausea, vomiting, constipation, diarrhea, dysuria, abdominal pain, or sensation of contractions. She denies any vaginal discharge, vaginal bleeding. She denies leg pain, leg swelling, or history of clots. She says her progress he has been unremarkable thus far. She reports that she previously used the name brand Proventil inhaler for her albuterol treatments however, she has been using a different brands generic albuterol for the last week. She is concerned that it is not working as well. She says she intermittently uses a spacer. He reports she is not on any other inhalers including no long-acting steroid use.  He reports that season changes and allergies cause her wheezing to worsen. She reports that she used her albuterol just before coming to the emergency department this morning. She denies any other complaints.    The history is provided by the patient, a parent and medical records. No language interpreter was used.  Wheezing   This is a recurrent problem. The current episode started more than 2 days ago. The problem occurs daily. The problem has been gradually improving. Pertinent negatives include no chest pain, no fever, no abdominal pain, no vomiting, no diarrhea, no dysuria, no headaches, no rhinorrhea, no neck pain, no cough, no hemoptysis, no sputum production and no rash. Precipitated by: season changes. She has tried  beta-agonist inhalers for the symptoms. The treatment provided significant relief. Her past medical history is significant for asthma. Her past medical history does not include CAD or PE.    Past Medical History:  Diagnosis Date  . Arthritis   . Asthma   . Chlamydia   . Eczema   . GERD (gastroesophageal reflux disease)   . HSV (herpes simplex virus) infection    history of-no current lesions  . Pyelonephritis complicating pregnancy, antepartum 07/17/2014    Patient Active Problem List   Diagnosis Date Noted  . Status post primary low transverse cesarean section 10/12/2014  . Normal labor 10/09/2014  . 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 Multiple food allergies--banana, fish oil, peanut 10/09/2014  . Asthma, chronic 10/09/2014  . Eczema 10/09/2014  . GBS (group B streptococcus) UTI complicating pregnancy--07/16/14 08/07/2014  . Short cervix 07/18/2014  . Pyelonephritis complicating pregnancy, antepartum 07/17/2014    Past Surgical History:  Procedure Laterality Date  . CESAREAN SECTION N/A 10/10/2014   Procedure: CESAREAN SECTION;  Surgeon: Purcell NailsAngela Y Roberts, MD;  Location: [facility];  Service: Obstetrics;  Laterality: N/A;  . HERNIA REPAIR    . NO PAST SURGERIES      OB History    Gravida Para Term Preterm AB Living   2 1 1     1    SAB TAB Ectopic Multiple Live Births         0 1       Home Medications    Prior to Admission medications   Medication Sig Start Date End Date Taking? Authorizing Provider  albuterol (PROVENTIL HFA;VENTOLIN HFA) 108 (90 Base) MCG/ACT inhaler Inhale 1-2 puffs into the lungs every 4 (four) hours as needed for wheezing or shortness of breath. 12/08/15   Courteney Lyn Mackuen, MD  calcium carbonate (TUMS - DOSED IN MG ELEMENTAL CALCIUM) 500 MG chewable tablet Chew 1 tablet by mouth as needed for indigestion or heartburn.    Historical Provider, MD  Prenatal MV-Min-FA-Omega-3 (PRENATAL GUMMIES/DHA & FA) 0.4-32.5 MG CHEW Chew 2 each by mouth daily.    Historical  Provider, MD    Family History No family history on file.  Social History Social History  Substance Use Topics  . Smoking status: Never Smoker  . Smokeless tobacco: Never Used  . Alcohol use No     Allergies   Fish oil; Shellfish allergy; Banana; and Peanut-containing drug products   Review of Systems Review of Systems  Constitutional: Negative for chills, diaphoresis, fatigue and fever.  HENT: Negative for congestion, rhinorrhea and trouble swallowing.   Eyes: Negative for visual disturbance.  Respiratory: Positive for wheezing. Negative for cough, hemoptysis, sputum production, chest tightness, shortness of breath and stridor.   Cardiovascular: Negative for chest pain, palpitations and leg swelling.  Gastrointestinal: Positive for abdominal distention (pregnant). Negative for abdominal pain, constipation, diarrhea, nausea and vomiting.  Genitourinary: Negative for dysuria, flank pain and frequency.  Musculoskeletal: Negative for back pain and neck pain.  Skin: Negative for rash.  Neurological: Negative for light-headedness and headaches.  Psychiatric/Behavioral: Negative for agitation and confusion.  All other systems reviewed and are negative.    Physical Exam Updated Vital Signs LMP 03/09/2016   Physical Exam  Constitutional: She is oriented to person, place, and time. She appears well-developed and well-nourished. No distress.  HENT:  Head: Normocephalic and atraumatic.  Right Ear: External ear normal.  Left Ear: External ear normal.  Nose: Nose normal.  Mouth/Throat: Oropharynx is clear and moist. No oropharyngeal exudate.  Eyes: Conjunctivae and EOM are normal. Pupils are equal, round, and reactive to light.  Neck: Normal range of motion. Neck supple.  Cardiovascular: Normal rate and regular rhythm.   No murmur heard. Pulmonary/Chest: Effort normal and breath sounds normal. No respiratory distress. She has no wheezes. She has no rales. She exhibits no  tenderness.  Abdominal: Soft. She exhibits distension (gravid). There is no tenderness.  Musculoskeletal: She exhibits no edema.  Neurological: She is alert and oriented to person, place, and time. No cranial nerve deficit. She exhibits normal muscle tone.  Skin: Skin is warm and dry. Capillary refill takes less than 2 seconds. No rash noted. No erythema.  Psychiatric: She has a normal mood and affect.  Nursing note and vitals reviewed.    ED Treatments / Results  Labs (all labs ordered are listed, but only abnormal results are displayed) Labs Reviewed - No data to display  EKG  EKG Interpretation None       Radiology No results found.  Procedures Procedures (including critical care time)  Medications Ordered in ED Medications  albuterol (PROVENTIL HFA;VENTOLIN HFA) 108 (90 Base) MCG/ACT inhaler 2 puff (2 puffs Inhalation Given 09/05/16 1014)     Initial Impression / Assessment and Plan / ED Course  I have reviewed the triage vital signs and the nursing notes.  Pertinent labs & imaging results that were available during my care of the patient were reviewed by me and considered in my medical decision making (see chart for details).  Clinical Course    Cheryl Logan is a 20 y.o. female who  is [redacted] weeks pregnant and has a history of asthma, eczema, and GERD presents with increase in albuterol use due to wheezing and shortness of breath  History and exam are seen above.  Fetal heart tones were confirmed by nursing upon arrival. Patient's exam completely unremarkable with no wheezing, crackles, or rhonchi on lung exam. Chest, back, and abdomen are nontender. Neuro exam is intact.  Patient's vitals slight tachycardia upon arrival however, on my assessment, heart rate was in the 80s. Patient had normal oxygen saturation and had no shortness of breath  Or tachypnea. Patient says she feels completely normal right now.  Patient is concerned that the generic brand albuterol  inhaler is not adequately treating her wheezing. She was offered initiation of Flovent as a low-dose steroid to complement the albuterol however, instead, patient wishes to get a prescription and try name brand Proair with a spacer to see if that helps. She reports that she will follow-up with her OB/GYN team for further management and return to the emergency department if the Proventil does not help. She reports that she will discuss adding steroids to her regimen when she sees her OB/GYN team.  She will be observed for a period time.  Patient had return of mild wheezing. Patient given albuterol treatment with resolution in wheezing.  Patient did not want to start steroids at this time. Patient given prescription for Pro air. Patient understood return precautions. Patient had no other questions or concerns and was discharged in good condition.   Final Clinical Impressions(s) / ED Diagnoses   Final diagnoses:  Mild asthma with exacerbation, unspecified whether persistent    New Prescriptions Discharge Medication List as of 09/05/2016 10:09 AM    START taking these medications   Details  !! albuterol (PROAIR HFA) 108 (90 Base) MCG/ACT inhaler Inhale 2 puffs into the lungs every 6 (six) hours as needed for wheezing or shortness of breath., Starting Fri 09/05/2016, Print     !! - Potential duplicate medications found. Please discuss with provider.     Clinical Impression: 1. Mild asthma with exacerbation, unspecified whether persistent     Disposition: Discharge  Condition: Good  I have discussed the results, Dx and Tx plan with the pt(& family if present). He/she/they expressed understanding and agree(s) with the plan. Discharge instructions discussed at great length. Strict return precautions discussed and pt &/or family have verbalized understanding of the instructions. No further questions at time of discharge.    Discharge Medication List as of 09/05/2016 10:09 AM    START taking  these medications   Details  !! albuterol (PROAIR HFA) 108 (90 Base) MCG/ACT inhaler Inhale 2 puffs into the lungs every 6 (six) hours as needed for wheezing or shortness of breath., Starting Fri 09/05/2016, Print     !! - Potential duplicate medications found. Please discuss with provider.      [facility] [address] [place] [id number] [phone]       Heide Scaleshristopher J Walsie Smeltz, MD 09/05/16 [phone]

## 2016-09-05 NOTE — Discharge Instructions (Signed)
Please follow-up with your OB/GYN team and PCP for further management of your asthma. If symptoms worsen, please return to the nearest emergency department. Please use your spacer.

## 2016-09-05 NOTE — ED Triage Notes (Signed)
C/o shortness of breath x 1 week. Has been using inhaler more than usual in past week.  C/o wheezing with hx of asthma. No cough. Pt is [redacted] weeks pregnant.

## 2016-10-01 LAB — OB RESULTS CONSOLE GC/CHLAMYDIA: Chlamydia: POSITIVE

## 2016-10-24 ENCOUNTER — Telehealth (HOSPITAL_COMMUNITY): Payer: Self-pay | Admitting: *Deleted

## 2016-10-24 ENCOUNTER — Other Ambulatory Visit: Payer: Self-pay | Admitting: Obstetrics and Gynecology

## 2016-10-24 ENCOUNTER — Encounter (HOSPITAL_COMMUNITY): Payer: Self-pay

## 2016-10-24 NOTE — Telephone Encounter (Signed)
Preadmission screen  

## 2016-10-24 NOTE — Patient Instructions (Signed)
20 [name]Aziza Qu  10/24/2016   Your procedure is scheduled on:  11/02/2016  [facility] at 0600 AM.  Pick up the phone at the desk and dial [phone].   Call this number if you have problems the morning of surgery: [phone]   Remember:   Do not eat food:4 Hours before arrival.  Do not drink clear liquids: After Midnight.  Take these medicines the morning of surgery with A SIP OF WATER: none, but please bring your inhaler with you   Do not wear jewelry, make-up or nail polish.  Do not wear lotions, powders, or perfumes. Do not wear deodorant.  Do not shave 48 hours prior to surgery.  Do not bring valuables to the hospital.  [facility] is not   responsible for any belongings or valuables brought to the hospital.  Contacts, dentures or bridgework may not be worn into surgery.  Leave suitcase in the car. After surgery it may be brought to your room.  For patients admitted to the hospital, checkout time is 11:00 AM the day of              discharge.   Patients discharged the day of surgery will not be allowed to drive             home.  Name and phone number of your driver: na  Special Instructions:   N/A   Please read over the following fact sheets that you were given:   Surgical Site Infection Prevention

## 2016-10-31 ENCOUNTER — Inpatient Hospital Stay (HOSPITAL_COMMUNITY)
Admission: RE | Admit: 2016-10-31 | Discharge: 2016-10-31 | Disposition: A | Discharge status: Home / Self Care | Payer: BLUE CROSS/BLUE SHIELD | Source: Ambulatory Visit

## 2016-11-02 ENCOUNTER — Encounter (HOSPITAL_COMMUNITY): Payer: Self-pay | Admitting: *Deleted

## 2016-11-02 ENCOUNTER — Inpatient Hospital Stay (HOSPITAL_COMMUNITY)
Admission: RE | Admit: 2016-11-02 | Payer: BLUE CROSS/BLUE SHIELD | Source: Ambulatory Visit | Admitting: Obstetrics and Gynecology

## 2016-11-02 ENCOUNTER — Inpatient Hospital Stay (HOSPITAL_COMMUNITY)
Admission: AD | Admit: 2016-11-02 | Discharge: 2016-11-05 | DRG: 765 | Disposition: A | Discharge status: Home / Self Care | Payer: BLUE CROSS/BLUE SHIELD | Source: Ambulatory Visit | Attending: Obstetrics and Gynecology | Admitting: Obstetrics and Gynecology

## 2016-11-02 ENCOUNTER — Inpatient Hospital Stay (HOSPITAL_COMMUNITY): Payer: BLUE CROSS/BLUE SHIELD | Admitting: Anesthesiology

## 2016-11-02 ENCOUNTER — Encounter (HOSPITAL_COMMUNITY)
Admission: AD | Disposition: A | Discharge status: Home / Self Care | Payer: Self-pay | Source: Ambulatory Visit | Attending: Obstetrics and Gynecology

## 2016-11-02 DIAGNOSIS — K219 Gastro-esophageal reflux disease without esophagitis: Secondary | ICD-10-CM | POA: Diagnosis present

## 2016-11-02 DIAGNOSIS — O9952 Diseases of the respiratory system complicating childbirth: Secondary | ICD-10-CM | POA: Diagnosis present

## 2016-11-02 DIAGNOSIS — O9962 Diseases of the digestive system complicating childbirth: Secondary | ICD-10-CM | POA: Diagnosis present

## 2016-11-02 DIAGNOSIS — O99824 Streptococcus B carrier state complicating childbirth: Secondary | ICD-10-CM | POA: Diagnosis present

## 2016-11-02 DIAGNOSIS — O26873 Cervical shortening, third trimester: Secondary | ICD-10-CM | POA: Diagnosis present

## 2016-11-02 DIAGNOSIS — A6000 Herpesviral infection of urogenital system, unspecified: Secondary | ICD-10-CM | POA: Diagnosis present

## 2016-11-02 DIAGNOSIS — Z3A39 39 weeks gestation of pregnancy: Secondary | ICD-10-CM

## 2016-11-02 DIAGNOSIS — A568 Sexually transmitted chlamydial infection of other sites: Secondary | ICD-10-CM | POA: Diagnosis present

## 2016-11-02 DIAGNOSIS — O9832 Other infections with a predominantly sexual mode of transmission complicating childbirth: Secondary | ICD-10-CM | POA: Diagnosis present

## 2016-11-02 DIAGNOSIS — O34211 Maternal care for low transverse scar from previous cesarean delivery: Principal | ICD-10-CM | POA: Diagnosis present

## 2016-11-02 DIAGNOSIS — J45909 Unspecified asthma, uncomplicated: Secondary | ICD-10-CM | POA: Diagnosis present

## 2016-11-02 LAB — CBC
HCT: 33.3 % — ABNORMAL LOW (ref 36.0–46.0)
Hemoglobin: 11 g/dL — ABNORMAL LOW (ref 12.0–15.0)
MCH: 25.1 pg — ABNORMAL LOW (ref 26.0–34.0)
MCHC: 33 g/dL (ref 30.0–36.0)
MCV: 76 fL — ABNORMAL LOW (ref 78.0–100.0)
Platelets: 348 10*3/uL (ref 150–400)
RBC: 4.38 MIL/uL (ref 3.87–5.11)
RDW: 13.7 % (ref 11.5–15.5)
WBC: 9.1 10*3/uL (ref 4.0–10.5)

## 2016-11-02 LAB — RAPID HIV SCREEN (HIV 1/2 AB+AG)
HIV 1/2 Antibodies: NONREACTIVE
HIV-1 P24 Antigen - HIV24: NONREACTIVE

## 2016-11-02 LAB — TYPE AND SCREEN
ABO/RH(D): B POS
Antibody Screen: NEGATIVE

## 2016-11-02 LAB — RPR: RPR Ser Ql: NONREACTIVE

## 2016-11-02 SURGERY — Surgical Case
Anesthesia: Spinal | Wound class: Clean Contaminated

## 2016-11-02 MED ORDER — MEPERIDINE HCL 25 MG/ML IJ SOLN: 6.2500 mg | INTRAMUSCULAR | Status: DC | PRN

## 2016-11-02 MED ORDER — NALBUPHINE HCL 10 MG/ML IJ SOLN: 5.0000 mg | Freq: Once | INTRAMUSCULAR | Status: DC | PRN

## 2016-11-02 MED ORDER — ONDANSETRON HCL 4 MG/2ML IJ SOLN
INTRAMUSCULAR | Status: DC | PRN
  Administered 2016-11-02: 4 mg via INTRAVENOUS

## 2016-11-02 MED ORDER — OXYTOCIN 40 UNITS IN LACTATED RINGERS INFUSION - SIMPLE MED: 2.5000 [IU]/h | INTRAVENOUS | Status: DC

## 2016-11-02 MED ORDER — COCONUT OIL OIL
1.0000 | TOPICAL_OIL | Status: DC | PRN
Start: 2016-11-02 — End: 2016-11-05

## 2016-11-02 MED ORDER — SIMETHICONE 80 MG PO CHEW
80.0000 mg | CHEWABLE_TABLET | Freq: Three times a day (TID) | ORAL | Status: DC
  Administered 2016-11-03 – 2016-11-05 (×8): 80 mg via ORAL
  Filled 2016-11-02 (×9): qty 1

## 2016-11-02 MED ORDER — MORPHINE SULFATE-NACL 0.5-0.9 MG/ML-% IV SOSY
PREFILLED_SYRINGE | INTRAVENOUS | Status: AC
  Filled 2016-11-02: qty 1

## 2016-11-02 MED ORDER — NALBUPHINE HCL 10 MG/ML IJ SOLN: 5.0000 mg | INTRAMUSCULAR | Status: DC | PRN

## 2016-11-02 MED ORDER — KETOROLAC TROMETHAMINE 30 MG/ML IJ SOLN
INTRAMUSCULAR | Status: AC
  Administered 2016-11-02: 30 mg via INTRAMUSCULAR
  Filled 2016-11-02: qty 1

## 2016-11-02 MED ORDER — BUPIVACAINE IN DEXTROSE 0.75-8.25 % IT SOLN
INTRATHECAL | Status: DC | PRN
  Administered 2016-11-02: 1.6 mL via INTRATHECAL

## 2016-11-02 MED ORDER — DIPHENHYDRAMINE HCL 25 MG PO CAPS
25.0000 mg | ORAL_CAPSULE | ORAL | Status: DC | PRN
  Filled 2016-11-02: qty 1

## 2016-11-02 MED ORDER — LACTATED RINGERS IV SOLN
INTRAVENOUS | Status: DC
  Administered 2016-11-02: 20:00:00 via INTRAVENOUS

## 2016-11-02 MED ORDER — ONDANSETRON HCL 4 MG/2ML IJ SOLN
4.0000 mg | Freq: Three times a day (TID) | INTRAMUSCULAR | Status: DC | PRN
  Administered 2016-11-02: 4 mg via INTRAVENOUS
  Filled 2016-11-02: qty 2

## 2016-11-02 MED ORDER — OXYTOCIN 10 UNIT/ML IJ SOLN
INTRAMUSCULAR | Status: DC | PRN
  Administered 2016-11-02: 40 [IU] via INTRAVENOUS

## 2016-11-02 MED ORDER — MENTHOL 3 MG MT LOZG
1.0000 | LOZENGE | OROMUCOSAL | Status: DC | PRN
  Filled 2016-11-02: qty 9

## 2016-11-02 MED ORDER — OXYTOCIN BOLUS FROM INFUSION: 500.0000 mL | Freq: Once | INTRAVENOUS | Status: DC

## 2016-11-02 MED ORDER — CEFAZOLIN SODIUM-DEXTROSE 2-4 GM/100ML-% IV SOLN
2.0000 g | INTRAVENOUS | Status: AC
  Administered 2016-11-02: 2 g via INTRAVENOUS

## 2016-11-02 MED ORDER — SOD CITRATE-CITRIC ACID 500-334 MG/5ML PO SOLN
30.0000 mL | ORAL | Status: AC
  Administered 2016-11-02: 30 mL via ORAL

## 2016-11-02 MED ORDER — FENTANYL CITRATE (PF) 100 MCG/2ML IJ SOLN
INTRAMUSCULAR | Status: AC
  Filled 2016-11-02: qty 2

## 2016-11-02 MED ORDER — SIMETHICONE 80 MG PO CHEW
80.0000 mg | CHEWABLE_TABLET | ORAL | Status: DC
  Administered 2016-11-02 – 2016-11-03 (×2): 80 mg via ORAL
  Filled 2016-11-02 (×3): qty 1

## 2016-11-02 MED ORDER — TETANUS-DIPHTH-ACELL PERTUSSIS 5-2.5-18.5 LF-MCG/0.5 IM SUSP: 0.5000 mL | Freq: Once | INTRAMUSCULAR | Status: DC

## 2016-11-02 MED ORDER — DIPHENHYDRAMINE HCL 50 MG/ML IJ SOLN
12.5000 mg | INTRAMUSCULAR | Status: DC | PRN
Start: 2016-11-02 — End: 2016-11-05

## 2016-11-02 MED ORDER — SIMETHICONE 80 MG PO CHEW: 80.0000 mg | CHEWABLE_TABLET | ORAL | Status: DC | PRN

## 2016-11-02 MED ORDER — ZOLPIDEM TARTRATE 5 MG PO TABS: 5.0000 mg | ORAL_TABLET | Freq: Every evening | ORAL | Status: DC | PRN

## 2016-11-02 MED ORDER — PHENYLEPHRINE 8 MG IN D5W 100 ML (0.08MG/ML) PREMIX OPTIME
INJECTION | INTRAVENOUS | Status: DC | PRN
  Administered 2016-11-02: 60 ug/min via INTRAVENOUS

## 2016-11-02 MED ORDER — ACETAMINOPHEN 325 MG PO TABS
650.0000 mg | ORAL_TABLET | ORAL | Status: DC | PRN
  Administered 2016-11-03: 650 mg via ORAL
  Filled 2016-11-02: qty 2

## 2016-11-02 MED ORDER — SODIUM CHLORIDE 0.9% FLUSH: 3.0000 mL | INTRAVENOUS | Status: DC | PRN

## 2016-11-02 MED ORDER — LACTATED RINGERS IV SOLN
INTRAVENOUS | Status: DC | PRN
  Administered 2016-11-02 (×2): via INTRAVENOUS

## 2016-11-02 MED ORDER — ONDANSETRON HCL 4 MG/2ML IJ SOLN
INTRAMUSCULAR | Status: AC
  Filled 2016-11-02: qty 2

## 2016-11-02 MED ORDER — DIBUCAINE 1 % RE OINT: 1.0000 "application " | TOPICAL_OINTMENT | RECTAL | Status: DC | PRN

## 2016-11-02 MED ORDER — FENTANYL CITRATE (PF) 100 MCG/2ML IJ SOLN
INTRAMUSCULAR | Status: DC | PRN
  Administered 2016-11-02: 10 ug via INTRATHECAL

## 2016-11-02 MED ORDER — WITCH HAZEL-GLYCERIN EX PADS: 1.0000 "application " | MEDICATED_PAD | CUTANEOUS | Status: DC | PRN

## 2016-11-02 MED ORDER — SCOPOLAMINE 1 MG/3DAYS TD PT72: 1.0000 | MEDICATED_PATCH | Freq: Once | TRANSDERMAL | Status: DC

## 2016-11-02 MED ORDER — ALBUTEROL SULFATE (2.5 MG/3ML) 0.083% IN NEBU: 2.5000 mg | INHALATION_SOLUTION | RESPIRATORY_TRACT | Status: DC | PRN

## 2016-11-02 MED ORDER — KETOROLAC TROMETHAMINE 30 MG/ML IJ SOLN: 30.0000 mg | Freq: Four times a day (QID) | INTRAMUSCULAR | Status: AC | PRN

## 2016-11-02 MED ORDER — IBUPROFEN 600 MG PO TABS
600.0000 mg | ORAL_TABLET | Freq: Four times a day (QID) | ORAL | Status: DC
  Administered 2016-11-02 – 2016-11-05 (×11): 600 mg via ORAL
  Filled 2016-11-02 (×12): qty 1

## 2016-11-02 MED ORDER — ONDANSETRON HCL 4 MG/2ML IJ SOLN: 4.0000 mg | Freq: Four times a day (QID) | INTRAMUSCULAR | Status: DC | PRN

## 2016-11-02 MED ORDER — OXYTOCIN 40 UNITS IN LACTATED RINGERS INFUSION - SIMPLE MED: 2.5000 [IU]/h | INTRAVENOUS | Status: AC

## 2016-11-02 MED ORDER — SENNOSIDES-DOCUSATE SODIUM 8.6-50 MG PO TABS
2.0000 | ORAL_TABLET | ORAL | Status: DC
  Administered 2016-11-02 – 2016-11-04 (×3): 2 via ORAL
  Filled 2016-11-02 (×3): qty 2

## 2016-11-02 MED ORDER — FENTANYL CITRATE (PF) 100 MCG/2ML IJ SOLN: 25.0000 ug | INTRAMUSCULAR | Status: DC | PRN

## 2016-11-02 MED ORDER — PHENYLEPHRINE 8 MG IN D5W 100 ML (0.08MG/ML) PREMIX OPTIME
INJECTION | INTRAVENOUS | Status: AC
  Filled 2016-11-02: qty 100

## 2016-11-02 MED ORDER — MORPHINE SULFATE (PF) 0.5 MG/ML IJ SOLN
INTRAMUSCULAR | Status: DC | PRN
  Administered 2016-11-02: .2 mg via INTRATHECAL

## 2016-11-02 MED ORDER — LACTATED RINGERS IV SOLN
INTRAVENOUS | Status: DC
  Administered 2016-11-02: 09:00:00 via INTRAVENOUS

## 2016-11-02 MED ORDER — LACTATED RINGERS IV SOLN: 500.0000 mL | INTRAVENOUS | Status: DC | PRN

## 2016-11-02 MED ORDER — DEXTROSE 5 % IV SOLN
1.0000 ug/kg/h | INTRAVENOUS | Status: DC | PRN
  Filled 2016-11-02: qty 2

## 2016-11-02 MED ORDER — PROMETHAZINE HCL 25 MG/ML IJ SOLN: 6.2500 mg | INTRAMUSCULAR | Status: DC | PRN

## 2016-11-02 MED ORDER — PRENATAL MULTIVITAMIN CH
1.0000 | ORAL_TABLET | Freq: Every day | ORAL | Status: DC
  Administered 2016-11-03 – 2016-11-05 (×3): 1 via ORAL
  Filled 2016-11-02 (×3): qty 1

## 2016-11-02 MED ORDER — NALOXONE HCL 0.4 MG/ML IJ SOLN: 0.4000 mg | INTRAMUSCULAR | Status: DC | PRN

## 2016-11-02 MED ORDER — DIPHENHYDRAMINE HCL 25 MG PO CAPS: 25.0000 mg | ORAL_CAPSULE | Freq: Four times a day (QID) | ORAL | Status: DC | PRN

## 2016-11-02 MED ORDER — KETOROLAC TROMETHAMINE 30 MG/ML IJ SOLN
30.0000 mg | Freq: Four times a day (QID) | INTRAMUSCULAR | Status: AC | PRN
  Administered 2016-11-02: 30 mg via INTRAMUSCULAR

## 2016-11-02 MED ORDER — OXYTOCIN 10 UNIT/ML IJ SOLN
INTRAMUSCULAR | Status: AC
  Filled 2016-11-02: qty 4

## 2016-11-02 MED ORDER — LACTATED RINGERS IV SOLN: INTRAVENOUS | Status: DC

## 2016-11-02 SURGICAL SUPPLY — 35 items
BENZOIN TINCTURE PRP APPL 2/3 (GAUZE/BANDAGES/DRESSINGS) ×2 IMPLANT
CELLS DAT CNTRL 66122 CELL SVR (MISCELLANEOUS) ×1 IMPLANT
CHLORAPREP W/TINT 26ML (MISCELLANEOUS) ×2 IMPLANT
CLAMP CORD UMBIL (MISCELLANEOUS) IMPLANT
CLOTH BEACON ORANGE TIMEOUT ST (SAFETY) ×2 IMPLANT
CONTAINER PREFILL 10% NBF 15ML (MISCELLANEOUS) IMPLANT
DRAIN JACKSON PRT FLT 10 (DRAIN) IMPLANT
DRSG OPSITE POSTOP 4X10 (GAUZE/BANDAGES/DRESSINGS) ×2 IMPLANT
ELECT REM PT RETURN 9FT ADLT (ELECTROSURGICAL) ×2
ELECTRODE REM PT RTRN 9FT ADLT (ELECTROSURGICAL) ×1 IMPLANT
EVACUATOR SILICONE 100CC (DRAIN) IMPLANT
EXTRACTOR VACUUM M CUP 4 TUBE (SUCTIONS) IMPLANT
GLOVE BIO SURGEON STRL SZ 6.5 (GLOVE) ×2 IMPLANT
GLOVE BIOGEL PI IND STRL 7.0 (GLOVE) ×2 IMPLANT
GLOVE BIOGEL PI INDICATOR 7.0 (GLOVE) ×2
GOWN STRL REUS W/TWL LRG LVL3 (GOWN DISPOSABLE) ×4 IMPLANT
KIT ABG SYR 3ML LUER SLIP (SYRINGE) IMPLANT
NEEDLE HYPO 25X5/8 SAFETYGLIDE (NEEDLE) IMPLANT
NS IRRIG 1000ML POUR BTL (IV SOLUTION) ×2 IMPLANT
PACK C SECTION WH (CUSTOM PROCEDURE TRAY) ×2 IMPLANT
PAD OB MATERNITY 4.3X12.25 (PERSONAL CARE ITEMS) ×2 IMPLANT
PENCIL SMOKE EVAC W/HOLSTER (ELECTROSURGICAL) ×2 IMPLANT
RTRCTR C-SECT PINK 25CM LRG (MISCELLANEOUS) IMPLANT
RTRCTR WOUND ALEXIS 18CM MED (MISCELLANEOUS) ×2
STRIP CLOSURE SKIN 1/2X4 (GAUZE/BANDAGES/DRESSINGS) ×2 IMPLANT
SUT CHROMIC 0 CT 1 (SUTURE) ×2 IMPLANT
SUT MNCRL AB 3-0 PS2 27 (SUTURE) ×2 IMPLANT
SUT PLAIN 2 0 (SUTURE) ×2
SUT PLAIN 2 0 XLH (SUTURE) ×2 IMPLANT
SUT PLAIN ABS 2-0 CT1 27XMFL (SUTURE) ×2 IMPLANT
SUT SILK 2 0 SH (SUTURE) IMPLANT
SUT VIC AB 0 CTX 36 (SUTURE) ×4
SUT VIC AB 0 CTX36XBRD ANBCTRL (SUTURE) ×4 IMPLANT
TOWEL OR 17X24 6PK STRL BLUE (TOWEL DISPOSABLE) ×2 IMPLANT
TRAY FOLEY CATH SILVER 14FR (SET/KITS/TRAYS/PACK) ×2 IMPLANT

## 2016-11-02 NOTE — Transfer of Care (Signed)
Immediate Anesthesia Transfer of Care Note  Patient: [name]Merissa Betty  Procedure(s) Performed: Procedure(s): CESAREAN SECTION (N/A)  Patient Location: PACU  Anesthesia Type:Spinal  Level of Consciousness: awake and alert   Airway & Oxygen Therapy: Patient Spontanous Breathing  Post-op Assessment: Report given to RN and Post -op Vital signs reviewed and stable  Post vital signs: Reviewed  Last Vitals:  Vitals:   11/02/16 0559 11/02/16 0808  BP: 108/72 118/72  Pulse: 96 100  Resp: 20 18  Temp: 36.5 C 36.7 C    Last Pain:  Vitals:   11/02/16 0815  TempSrc:   PainSc: 0-No pain         Complications: No apparent anesthesia complications

## 2016-11-02 NOTE — H&P (Addendum)
[name] is a 20 y.o. female, G2P1001 at 3339 weeks, presenting for Elective Repeat Cesarean Section.  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  Patient has been [facility]  Patient Active Problem List   Diagnosis Date Noted  . Indication for care or intervention in labor or delivery 11/02/2016  . Status post primary low transverse cesarean section 10/12/2014  . Normal labor 10/09/2014  . 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 Multiple food allergies--banana, fish oil, peanut 10/09/2014  . Asthma, chronic 10/09/2014  . Eczema 10/09/2014  . GBS (group B streptococcus) UTI complicating pregnancy--07/16/14 08/07/2014  . Short cervix 07/18/2014  . Pyelonephritis complicating pregnancy, antepartum 07/17/2014    History of present pregnancy: Patient entered care at 12.6 weeks.   EDC of 11/10/2015 was established by 02/03/2016.   Anatomy scan:  23.3 weeks, with normal, but limited findings and an posterior placenta.   Additional US evaluations:   F/U Anatomy at 27.3wks  Significant prenatal events: [facility] 12.6[facility]. Common pregnancy complaints including abdominal pain, vaginal bleeding, and nausea.  [facility] and follow up regarding testing has been inconsistent due to this.  Last evaluation:  10/22/2016 with L. Clemmons, CNM   OB History    Gravida Para Term Preterm AB Living   2 1 1     1    SAB TAB Ectopic Multiple Live Births         0 1     Past Medical History:  Diagnosis Date  . Asthma   . Chlamydia   . Eczema   . GERD (gastroesophageal reflux disease)   . HSV (herpes simplex virus) infection    history of-no current lesions  . Pyelonephritis complicating pregnancy, antepartum 07/17/2014   Past Surgical History:  Procedure Laterality Date  . CESAREAN SECTION N/A 10/10/2014   Procedure: CESAREAN SECTION;  Surgeon: Purcell NailsAngela Y Roberts,  MD;  Location: [facility];  Service: Obstetrics;  Laterality: N/A;  . HERNIA REPAIR    . NO PAST SURGERIES     Family History: family history is not on file. Social History:  reports that she has never smoked. She has never used smokeless tobacco. She reports that she does not drink alcohol or use drugs.   Prenatal Transfer Tool  Maternal Diabetes: No Genetic Screening: Declined Maternal Ultrasounds/Referrals: Normal Fetal Ultrasounds or other Referrals:  None Maternal Substance Abuse:  No Significant Maternal Medications:  Meds include: Other:  Valtrex Significant Maternal Lab Results: Lab values include: Group B Strep positive, CT Positive, Rubella NI    ROS:  +FM, -Ctx, -LoF, -VB, -HSV Lesions No recent illness or GI upset.    Allergies  Allergen Reactions  . Fish Oil Anaphylaxis  . Shellfish Allergy Anaphylaxis  . Banana Swelling and Other (See Comments)    Reaction:  Tongue/mouth swelling  . Peanut-Containing Drug Products Swelling and Other (See Comments)    Reaction:  Tongue/mouth swelling       Last menstrual period 03/09/2016.  Physical Exam  Constitutional: She is oriented to person, place, and time. She appears well-developed and well-nourished. No distress.  HENT:  Head: Normocephalic and atraumatic.  Eyes: Conjunctivae are normal.  Neck: Normal range of motion.  Cardiovascular: Normal rate, regular rhythm and normal heart sounds.   Respiratory: Effort normal and breath sounds normal.  GI: Soft. Bowel sounds are normal.  Musculoskeletal: Normal range of motion. She exhibits no  edema.  Neurological: She is alert and oriented to person, place, and time.  Skin: Skin is warm and dry.    FHR: 125 bpm, Mod Var, -Decels, +Accels UCs:  Occasional  Prenatal labs: ABO, Rh: B/Positive/-- (06/30 0000) Antibody: Negative (06/30 0000) Rubella:  Non Immune RPR: Nonreactive (06/30 0000)  HBsAg: Negative (06/30 0000)  HIV: Non-reactive (06/30 0000)  GBS:   Positive Sickle cell/Hgb electrophoresis:  Normal Pap:  Abnormal-ASCUS GC:  ND Chlamydia:  Detected on 11/29 Other:  None    Assessment IUP at 39wks Cat I FT Repeat Elective C/S Unknown CT Status Asthma Allergies: Shellfish, Peanuts, Banana  [facility] with Dr. Dorris CarnesN. Lynn Recendiz Routine Orders Pre-Op CT via urine R/B reviewed including, but not limited to, infection, bleeding, pain, damage to organs or fetus resulting in need for additional surgery.  Patient understands and accepts these risks and wishes to proceed with c/s.  Joellyn QuailsJessica L EmlyCNM, MSN 11/02/2016, 6:01 AM  Date of Initial H&P: today  History reviewed, patient examined, no change in status, stable for surgery.

## 2016-11-02 NOTE — Anesthesia Procedure Notes (Signed)
Spinal  Patient location during procedure: OR Start time: 11/02/2016 8:28 AM End time: 11/02/2016 8:30 AM Staffing Anesthesiologist: Suella Broad D Preanesthetic Checklist Completed: patient identified, site marked, surgical consent, pre-op evaluation, timeout performed, IV checked, risks and benefits discussed and monitors and equipment checked Spinal Block Patient position: sitting Prep: Betadine Patient monitoring: heart rate, continuous pulse ox, blood pressure and cardiac monitor Approach: midline Location: L4-5 Injection technique: single-shot Needle Needle type: Introducer and Pencan  Needle gauge: 24 G Needle length: 9 cm Additional Notes Negative paresthesia. Negative blood return. Positive free-flowing CSF. Expiration date of kit checked and confirmed. Patient tolerated procedure well, without complications.

## 2016-11-02 NOTE — Op Note (Signed)
Cesarean Section Procedure Note   [name]  11/02/2016  Indications: Scheduled Proceedure/Maternal Request   Pre-operative Diagnosis: Prior Cesarean Section.   Post-operative Diagnosis: Same   Surgeon: Surgeon(s) and Role:    * Jaymes GraffNaima Henny Strauch, MD - Primary   Assistants: L. Clemmons CNM   Anesthesia: spinal   Procedure Details:  The patient was seen in the Holding Room. The risks, benefits, complications, treatment options, and expected outcomes were discussed with the patient. The patient concurred with the proposed plan, giving informed consent. identified as [name] and the procedure verified as C-Section Delivery. A Time Out was held and the above information confirmed.  After induction of anesthesia, the patient was draped and prepped in the usual sterile manner. A transverse incision was made and carried down through the subcutaneous tissue to the fascia. Fascial incision was made in the midline and extended transversely. The fascia was separated from the underlying rectus muscle superiorly and inferiorly. The peritoneum was identified and entered. Peritoneal incision was extended longitudinally with good visualization of bowel and bladder. The utero-vesical peritoneal reflection was incised transversely and the bladder flap was bluntly freed from the lower uterine segment.  An alexsis retractor was placed in the abdomen.   A low transverse uterine incision was made. Delivered from cephalic presentation was a  infant, with Apgar scores of 10 at one minute and 10 at five minutes. Cord ph was not sent the umbilical cord was clamped and cut cord blood was obtained for evaluation. The placenta was removed Intact and appeared normal. The uterine outline, tubes and ovaries appeared normal}. The uterine incision was closed with running locked sutures of 0Vicryl. A second layer 0 vicrlyl was used to imbricate the uterine incision    Hemostasis was observed. Lavage was carried  out until clear. The alexsis was removed.  The peritoneum was closed with 0 chromic.  The muscles were examined and any bleeders were made hemostatic using bovie cautery device.   The fascia was then reapproximated with running sutures of 0 vicryl.  The subcutaneous tissue was reapproximated  With interrupted stitches using 2-0 plain gut. The subcuticular closure was performed using 3-850monocryl     Instrument, sponge, and needle counts were correct prior the abdominal closure and were correct at the conclusion of the case.    Findings: infant was delivered from vtx presentation. The fluid was clear.  The uterus tubes and ovaries appeared normal.     Estimated Blood Loss: 500cc   Total IV Fluids: 2400ml   Urine Output: 50CC OF clear urine  Specimens: placenta  Complications: no complications  Disposition: PACU - hemodynamically stable.   Maternal Condition: stable   Baby condition / location:  Couplet care / Skin to Skin  Attending Attestation: I performed the procedure.   Signed: Surgeon(s): Jaymes GraffNaima Latrisha Coiro, MD

## 2016-11-02 NOTE — Anesthesia Preprocedure Evaluation (Addendum)
Anesthesia Evaluation  Patient identified by MRN, date of birth, ID band Patient awake    Reviewed: Allergy & Precautions, NPO status , Patient's Chart, lab work & pertinent test results  Airway Mallampati: II  TM Distance: >3 FB Neck ROM: Full    Dental  (+) Teeth Intact, Dental Advisory Given   Pulmonary asthma ,    breath sounds clear to auscultation       Cardiovascular negative cardio ROS   Rhythm:Regular Rate:Normal     Neuro/Psych negative neurological ROS  negative psych ROS   GI/Hepatic Neg liver ROS, GERD  ,  Endo/Other  negative endocrine ROS  Renal/GU negative Renal ROS  negative genitourinary   Musculoskeletal negative musculoskeletal ROS (+)   Abdominal   Peds negative pediatric ROS (+)  Hematology negative hematology ROS (+)   Anesthesia Other Findings   Reproductive/Obstetrics (+) Pregnancy                           Lab Results  Component Value Date   WBC 9.1 11/02/2016   HGB 11.0 (L) 11/02/2016   HCT 33.3 (L) 11/02/2016   MCV 76.0 (L) 11/02/2016   PLT 348 11/02/2016   Lab Results  Component Value Date   CREATININE 0.79 09/27/2014   BUN 9 09/27/2014   NA 134 (L) 09/27/2014   K 3.8 09/27/2014   CL 102 09/27/2014   CO2 19 09/27/2014   No results found for: INR, PROTIME   Anesthesia Physical Anesthesia Plan  ASA: II  Anesthesia Plan: Spinal   Post-op Pain Management:    Induction:   Airway Management Planned:   Additional Equipment:   Intra-op Plan:   Post-operative Plan:   Informed Consent: I have reviewed the patients History and Physical, chart, labs and discussed the procedure including the risks, benefits and alternatives for the proposed anesthesia with the patient or authorized representative who has indicated his/her understanding and acceptance.     Plan Discussed with:   Anesthesia Plan Comments:         Anesthesia Quick  Evaluation

## 2016-11-02 NOTE — Lactation Note (Signed)
This note was copied from a baby's chart. Lactation Consultation Note  Patient Name: Cheryl Logan Today's Date: 11/02/2016 Reason for consult: Initial assessment Baby at 8 hr of life. Mom was sleeping. There was a man sleeping in the couch and a man holding the baby. Left lactation handouts with man holding the baby and instructed to have mom call for lactation at the next bf.   Maternal Data    Feeding    LATCH Score/Interventions                      Lactation Tools Discussed/Used     Consult Status Consult Status: Follow-up Date: 11/03/16 Follow-up type: In-patient    Cheryl Logan 11/02/2016, 5:12 PM

## 2016-11-02 NOTE — Anesthesia Postprocedure Evaluation (Signed)
Anesthesia Post Note  Patient: Cheryl Logan  Procedure(s) Performed: Procedure(s) (LRB): CESAREAN SECTION (N/A)  Patient location during evaluation: PACU Anesthesia Type: Spinal Level of consciousness: oriented and awake and alert Pain management: pain level controlled Vital Signs Assessment: post-procedure vital signs reviewed and stable Respiratory status: spontaneous breathing, respiratory function stable and patient connected to nasal cannula oxygen Cardiovascular status: blood pressure returned to baseline and stable Postop Assessment: no headache and no backache Anesthetic complications: no        Last Vitals:  Vitals:   11/02/16 1130 11/02/16 1145  BP: 106/65 102/65  Pulse: 67 69  Resp: 16 15  Temp: 36.2 C 36.3 C    Last Pain:  Vitals:   11/02/16 1145  TempSrc: Oral  PainSc:    Pain Goal:                 Cheryl Logan

## 2016-11-03 LAB — CBC
HCT: 24.9 % — ABNORMAL LOW (ref 36.0–46.0)
Hemoglobin: 8.3 g/dL — ABNORMAL LOW (ref 12.0–15.0)
MCH: 25.6 pg — ABNORMAL LOW (ref 26.0–34.0)
MCHC: 33.3 g/dL (ref 30.0–36.0)
MCV: 76.9 fL — ABNORMAL LOW (ref 78.0–100.0)
Platelets: 249 10*3/uL (ref 150–400)
RBC: 3.24 MIL/uL — ABNORMAL LOW (ref 3.87–5.11)
RDW: 13.8 % (ref 11.5–15.5)
WBC: 9.3 10*3/uL (ref 4.0–10.5)

## 2016-11-03 MED ORDER — OXYCODONE-ACETAMINOPHEN 5-325 MG PO TABS
2.0000 | ORAL_TABLET | ORAL | Status: DC | PRN
  Administered 2016-11-03 – 2016-11-04 (×2): 2 via ORAL
  Filled 2016-11-03 (×2): qty 2

## 2016-11-03 MED ORDER — OXYCODONE-ACETAMINOPHEN 5-325 MG PO TABS
1.0000 | ORAL_TABLET | ORAL | Status: DC | PRN
  Administered 2016-11-03: 1 via ORAL
  Filled 2016-11-03: qty 1

## 2016-11-03 NOTE — Lactation Note (Signed)
This note was copied from a baby's chart. Lactation Consultation Note  Experienced BF mom reports baby is latching well, no pain with latch. LS 8-10 by RN. Good output. States she has done this before. Asking about pumping and feeding with a bottle so she does not have to wake up. Encouraged frequent nursing to promote a good milk supply. BF brochure reviewed with parents. Reviewed our phone number, BFS[facility] and OP appointments as resources after DC. No questions at present.  To call prn  Patient Name: Cheryl Logan Today's Date: 11/03/2016 Reason for consult: Initial assessment   Maternal Data Formula Feeding for Exclusion: No Has patient been taught Hand Expression?: Yes (per mom she knows how) Does the patient have breastfeeding experience prior to this delivery?: Yes  Feeding Feeding Type: Breast Fed  LATCH Score/Interventions Latch: Grasps breast easily, tongue down, lips flanged, rhythmical sucking.  Audible Swallowing: Spontaneous and intermittent  Type of Nipple: Everted at rest and after stimulation  Comfort (Breast/Nipple): Soft / non-tender     Hold (Positioning): No assistance needed to correctly position infant at breast.  LATCH Score: 10  Lactation Tools Discussed/Used     Consult Status Consult Status: Follow-up Date: 11/04/16 Follow-up type: In-patient    Cheryl Logan, Cheryl Logan 11/03/2016, 1:40 PM

## 2016-11-03 NOTE — Progress Notes (Signed)
Subjective: Postpartum Day 1: Cesarean Delivery Patient reports that pain is well-managed with percocet. .Lochia normal.  Ambulating, voiding, tolerating diet as ordered without difficulty. Normal flatus.  Absent bowel movement.  Objective: Vital signs in last 24 hours: Temp:  [97.3 F (36.3 C)-98.1 F (36.7 C)] 97.7 F (36.5 C) (01/01 0438) Pulse Rate:  [69-100] 81 (01/01 0438) Resp:  [15-18] 18 (01/01 0438) BP: (102-120)/(49-67) 110/56 (01/01 0438) SpO2:  [96 %-100 %] 98 % (01/01 0438)  Physical Exam:  General: alert Lochia: appropriate Uterine Fundus: firm and appropriately tender Incision: dressing dry and clean DVT Evaluation: No evidence of DVT seen on physical exam. Edema 1+   Recent Labs  11/02/16 0636 11/03/16 0515  HGB 11.0* 8.3*  HCT 33.3* 24.9*    Assessment/Plan: Status post Cesarean section. Doing well postoperatively.  Continue current care. Anticipate discharge 11/06/15  Lawson FiscalLori A ClemmonsCNM 11/03/2016, 11:42 AM

## 2016-11-03 NOTE — Plan of Care (Signed)
Problem: Pain Managment: Goal: General experience of comfort will improve Outcome: Progressing Pt has no pain at this time. [facility] Notified of PRN pain medication if needed.  Problem: Physical Regulation: Goal: Ability to maintain clinical measurements within normal limits will improve Outcome: Progressing Pt is able to tolerates regular diet, VSS, pt is able to ambulate to bathroom and back with SBA.   Problem: Activity: Goal: Risk for activity intolerance will decrease Outcome: Progressing Pt able to stand up and ambulate to bathroom and back independently and with steady gait.   Problem: Fluid Volume: Goal: Ability to maintain a balanced intake and output will improve Outcome: Progressing Pt able to tolerate regular diet and liquids with no more nausea/vomitting.   Problem: Nutrition: Goal: Adequate nutrition will be maintained Outcome: Progressing Pt improved from clear liquids to regular diet. No c/o N/V.   Problem: Bowel/Gastric: Goal: Will not experience complications related to bowel motility Outcome: Progressing Pt has hx of ileus. Pt is slightly distended to palpation but bowel sounds active and audible x4 quadrants. Patient not passing gas at this time. Simethicone given per MAR.

## 2016-11-04 LAB — GC/CHLAMYDIA PROBE AMP (~~LOC~~) NOT AT ARMC
Chlamydia: POSITIVE — AB
Neisseria Gonorrhea: NEGATIVE

## 2016-11-04 NOTE — Plan of Care (Signed)
Problem: Physical Regulation: Goal: Will remain free from infection Outcome: Progressing Pt VSS; afebrile; labs being monitored.

## 2016-11-04 NOTE — Progress Notes (Signed)
[name] [id number] Postpartum Day 2 S/P Repeat Cesarean Section due to Patient Desire  Subjective: Patient up ad lib, denies syncope or dizziness. Reports consuming regular diet without issues and denies N/V. Patient reports no bowel movement or passing flatus.  Denies issues with urination and reports bleeding is "light."  Patient is breastfeeding and reports going well.  Desires for postpartum contraception not addressed.  Pain is being appropriately managed with use of percocet and motrin.   Objective: Temp:  [97 F (36.1 C)-98.5 F (36.9 C)] 97.7 F (36.5 C) (01/02 0300) Pulse Rate:  [77-92] 77 (01/02 0300) Resp:  [15-18] 15 (01/02 0300) BP: (108-114)/(55-67) 114/56 (01/02 0300) SpO2:  [99 %] 99 % (01/01 2100)   Recent Labs  11/02/16 0636 11/03/16 0515  HGB 11.0* 8.3*  HCT 33.3* 24.9*  WBC 9.1 9.3    Physical Exam:  General: alert, cooperative and no distress Mood/Affect: Appropriate/Appropriate Lungs: clear to auscultation, no wheezes, rales or rhonchi, symmetric air entry.  Heart: normal rate and regular rhythm. Breast: breasts appear normal, no suspicious masses, no skin or nipple changes or axillary nodes, Filling. Abdomen:  + bowel sounds, Soft, Appropriately Tender, Mild Distention Incision: Honeycomb dressing Intact Uterine Fundus: firm at umbilicus Lochia: appropriate Skin: Warm, Dry. DVT Evaluation: No evidence of DVT seen on physical exam. No significant calf/ankle edema. JP drain:   None  Assessment Post Operative Day 2 S/P Repeat C/S Normal Involution BreatFeeding Hemodynamically Stable Female Infant  Plan: -Declines discharge today -Concerns regarding no bowel movement -Encouraged to ambulate and drink hot fluid in addition to medications -Plan for discharge tomorrow -Outpatient circ -Continue other mgmt as ordered -Dr. Lynford HumphreySR to be updated on patient status  Cheryl RobinsJessica L Logan Dise MSN, CNM 11/04/2016, 7:03 AM

## 2016-11-05 ENCOUNTER — Telehealth (HOSPITAL_COMMUNITY): Payer: Self-pay

## 2016-11-05 MED ORDER — AZITHROMYCIN 200 MG/5ML PO SUSR: 1000.0000 mg | Freq: Once | ORAL | 0 refills | Status: AC

## 2016-11-05 MED ORDER — AZITHROMYCIN 1 G PO PACK
1.0000 g | PACK | Freq: Once | ORAL | Status: AC
  Administered 2016-11-05: 1 g via ORAL
  Filled 2016-11-05: qty 1

## 2016-11-05 MED ORDER — IBUPROFEN 600 MG PO TABS: 600.0000 mg | ORAL_TABLET | Freq: Four times a day (QID) | ORAL | 0 refills | Status: DC

## 2016-11-05 MED ORDER — OXYCODONE-ACETAMINOPHEN 5-325 MG PO TABS: 1.0000 | ORAL_TABLET | ORAL | 0 refills | Status: DC | PRN

## 2016-11-05 NOTE — Lactation Note (Signed)
This note was copied from a baby's chart. Lactation Consultation Note expereinced BF mom of 1 yr to her now 89 yr old has been BF this baby w/o difficulty. Transitional milk coming in, breast filling. Mom has DEBP at home. States she doesn't have any questions or concerns about BF at this time. Mom feels that she and baby are doing well, ready to go home today. Reminded of LC OP services.  Discussed supply and demand, engorgement.  Patient Name: [name]Sean Tomaso ZDGLO'VToday's Date: 11/05/2016 Reason for consult: Follow-up assessment   Maternal Data    Feeding Feeding Type: Breast Fed Length of feed: 15 min  LATCH Score/Interventions                      Lactation Tools Discussed/Used     Consult Status Consult Status: Complete Date: 11/05/16    Charyl DancerCARVER, Man Effertz G 11/05/2016, 6:03 AM

## 2016-11-05 NOTE — Discharge Summary (Signed)
OB Discharge Summary     Patient Name: Cheryl Logan DOB: 1996-06-06 MRN: [id number]  Date of admission: 11/02/2016 Delivering MD: Crawford Givens   Date of discharge: 11/05/2016  Admitting diagnosis: C-SECTION Intrauterine pregnancy: [redacted]w[redacted]d    Secondary diagnosis:  Active Problems:   Indication for care or intervention in labor or delivery  Additional problems:Chlamydia infection     Discharge diagnosis:                                    Term Pregnancy Delivered   Chlamydia infection                                                        Post partum procedures:None  Complications: None  Hospital course:  Sceduled C/S   21y.o. yo G2P2002 at 368w0das admitted to the hospital 11/02/2016 for scheduled cesarean section with the following indication:Elective Repeat.  Membrane Rupture Time/Date: 8:59 AM ,11/02/2016   Patient delivered a Viable female infant.11/02/2016.  She did not desire circumcision of neonate.    Details of operation can be found in separate operative note.  Patient had an uncomplicated postpartum course.  She is ambulating, tolerating a regular diet, passing flatus, and urinating well. Patient is discharged home in stable condition on  11/05/16.  GC/chlamydia test drawn on 11/02/16 came back positive for chlamydia, patient was treated with azithromycin suspension and she requested expedited partner therapy of her partner and a prescription was given, her partner had been treated by expedited partner therapy before without any complications.  We discussed chlamydia infection in detail and need to avoid unprotected intercourse with untreated partner.  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      Patient did not get rubella vaccination for her rubella equivocal status and plan is to refer her for MMR vaccine to her PCP.          Physical exam Vitals:   11/04/16 0300 11/04/16 1810  11/04/16 1938 11/05/16 0500  BP: (!) 114/56 117/64 108/72 (!) 117/52  Pulse: 77 89 83 74  Resp: _0 Temp: 97.7 F (36.5 C) 98 F (36.7 C) 98 F (36.7 C) 98.1 F (36.7 C)  TempSrc: Oral Oral Axillary Oral  SpO2:      Weight:      Height:       General: alert, cooperative and no distress Lochia: appropriate Uterine Fundus: firm Incision: With small dried blood towards right side of incision.  DVT Evaluation: No evidence of DVT seen on physical exam. Labs: Lab Results  Component Value Date   WBC 9.3 11/03/2016   HGB 8.3 (L) 11/03/2016   HCT 24.9 (L) 11/03/2016   MCV 76.9 (L) 11/03/2016   PLT 249 11/03/2016   Prenatal labs: ABO, Rh: B/Positive/-- (06/30 0000) Antibody: Negative (06/30 0000) Rubella:  Equivocal RPR: Nonreactive (06/30 0000)  HBsAg: Negative (06/30 0000)  HIV: Non-reactive (06/30 0000)  GBS:  Positive Sickle cell/Hgb electrophoresis:  Normal Pap:  Abnormal-ASCUS GC:  ND Chlamydia:  Detected on 11/29 Other:  None  Discharge instruction: per After Visit Summary and "Baby and Me Booklet", Chlamydia, Cesarean delivery  care.   After visit meds:  Allergies as of 11/05/2016      Reactions   Fish Allergy Anaphylaxis   Shellfish Allergy Anaphylaxis   Banana Swelling, Other (See Comments)   Reaction:  Tongue/mouth swelling   Peanut-containing Drug Products Swelling, Other (See Comments)   Reaction:  Tongue/mouth swelling      Medication List    TAKE these medications   albuterol 108 (90 Base) MCG/ACT inhaler Commonly known as:  PROAIR HFA Inhale 2 puffs into the lungs every 6 (six) hours as needed for wheezing or shortness of breath.   azithromycin 200 MG/5ML suspension Commonly known as:  ZITHROMAX Take 25 mLs (1,000 mg total) by mouth once.   ibuprofen 600 MG tablet Commonly known as:  ADVIL,MOTRIN Take 1 tablet (600 mg total) by mouth every 6 (six) hours.   oxyCODONE-acetaminophen 5-325 MG tablet Commonly known as:   PERCOCET/ROXICET Take 1 tablet by mouth every 4 (four) hours as needed for moderate pain.   PRENATAL GUMMIES/DHA & FA 0.4-32.5 MG Chew Chew 2 each by mouth daily.       Diet: routine diet  Activity: Advance as tolerated. Pelvic rest for 6 weeks.   Outpatient follow up:6 weeks for postpartum visit at office with GC/chlamydia test of cure, will need referral for MMR vaccine.   Postpartum contraception: IUD  Newborn Data: Live born female  Birth Weight: 7 lb 11.6 oz (3505 g) APGAR: 10, 10  Baby Feeding: Breast Disposition:home with mother  11/05/2016 Alinda Dooms, MD

## 2016-11-06 NOTE — Progress Notes (Signed)
Post discharge chart review completed.  

## 2017-01-06 NOTE — Telephone Encounter (Signed)
See "Reason for call" 

## 2017-01-22 ENCOUNTER — Emergency Department (HOSPITAL_BASED_OUTPATIENT_CLINIC_OR_DEPARTMENT_OTHER): Payer: BLUE CROSS/BLUE SHIELD

## 2017-01-22 ENCOUNTER — Encounter (HOSPITAL_BASED_OUTPATIENT_CLINIC_OR_DEPARTMENT_OTHER): Payer: Self-pay | Admitting: Emergency Medicine

## 2017-01-22 ENCOUNTER — Emergency Department (HOSPITAL_BASED_OUTPATIENT_CLINIC_OR_DEPARTMENT_OTHER)
Admission: EM | Admit: 2017-01-22 | Discharge: 2017-01-23 | Disposition: A | Discharge status: Home / Self Care | Payer: BLUE CROSS/BLUE SHIELD | Attending: Emergency Medicine | Admitting: Emergency Medicine

## 2017-01-22 DIAGNOSIS — J0100 Acute maxillary sinusitis, unspecified: Secondary | ICD-10-CM

## 2017-01-22 DIAGNOSIS — J45909 Unspecified asthma, uncomplicated: Secondary | ICD-10-CM | POA: Insufficient documentation

## 2017-01-22 MED ORDER — IBUPROFEN 800 MG PO TABS
800.0000 mg | ORAL_TABLET | Freq: Once | ORAL | Status: AC
  Administered 2017-01-22: 800 mg via ORAL
  Filled 2017-01-22: qty 1

## 2017-01-22 MED ORDER — AMOXICILLIN-POT CLAVULANATE 875-125 MG PO TABS: 1.0000 | ORAL_TABLET | Freq: Two times a day (BID) | ORAL | 0 refills | Status: DC

## 2017-01-22 NOTE — ED Notes (Signed)
No answer when called to tx room 

## 2017-01-22 NOTE — ED Notes (Signed)
No answer  Unable to locate pt

## 2017-01-22 NOTE — ED Triage Notes (Signed)
Patient states that she has had some sinus congestion x 2 -3 days. Children at home are sick as well. Fever noted today and sinus congestion with headache worse. She took tylenol at 4 pm - unsure if correct dosing since she took infants

## 2017-01-22 NOTE — ED Notes (Signed)
Pt came to registration asking if she had been called. States she has been in another part of the hospital watching a movie.

## 2017-01-22 NOTE — ED Provider Notes (Signed)
[facility]   CSN: [id number] Arrival date & time: 01/22/17  1854   By signing my name below, I, Teofilo PodMatthew P. Jamison, attest that this documentation has been prepared under the direction and in the presence of Felicie Mornavid Minyon Billiter, NP. Electronically Signed: Teofilo PodMatthew P. Jamison, ED Scribe. 01/22/2017. 10:35 PM.   History   Chief Complaint Chief Complaint  Patient presents with  . Fever  . Nasal Congestion   The history is provided by the patient. No language interpreter was used.   HPI Comments:  [name] is a 21 y.o. female who presents to the Emergency Department complaining of constant congestion x 3 days. Pt reports that she started having a fever of 103 today. Pt complains of associated cough, sneezing, rhinorrhea. She states that her rhinorrhea has been yellow and her cough is productive with blood tinged sputum. Pt is currently breastfeeding, and denies any current pregnancy. Pt gave birth on 11/02/2016, and is sexually active. Pt has taken tylenol with mild relief for fever. Pt denies other associated symptoms.   Past Medical History:  Diagnosis Date  . Asthma   . Chlamydia   . Eczema   . GERD (gastroesophageal reflux disease)   . HSV (herpes simplex virus) infection    history of-no current lesions  . Pyelonephritis complicating pregnancy, antepartum 07/17/2014    Patient Active Problem List   Diagnosis Date Noted  . Indication for care or intervention in labor or delivery 11/02/2016  . Status post primary low transverse cesarean section 10/12/2014  . Normal labor 10/09/2014  . 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 Multiple food allergies--banana, fish oil, peanut 10/09/2014  . Asthma, chronic 10/09/2014  . Eczema 10/09/2014  . GBS (group B streptococcus) UTI complicating pregnancy--07/16/14 08/07/2014  . Short cervix 07/18/2014  . Pyelonephritis complicating pregnancy, antepartum 07/17/2014    Past Surgical History:  Procedure Laterality Date  .  CESAREAN SECTION N/A 10/10/2014   Procedure: CESAREAN SECTION;  Surgeon: Purcell NailsAngela Y Roberts, MD;  Location: [facility];  Service: Obstetrics;  Laterality: N/A;  . CESAREAN SECTION N/A 11/02/2016   Procedure: CESAREAN SECTION;  Surgeon: Jaymes GraffNaima Dillard, MD;  Location: [facility];  Service: Obstetrics;  Laterality: N/A;  . HERNIA REPAIR    . NO PAST SURGERIES      OB History    Gravida Para Term Preterm AB Living   2 2 2     2    SAB TAB Ectopic Multiple Live Births         0 2       Home Medications    Prior to Admission medications   Medication Sig Start Date End Date Taking? Authorizing Provider  albuterol (PROAIR HFA) 108 (90 Base) MCG/ACT inhaler Inhale 2 puffs into the lungs every 6 (six) hours as needed for wheezing or shortness of breath. 09/05/16   Canary Brimhristopher J Tegeler, MD  ibuprofen (ADVIL,MOTRIN) 600 MG tablet Take 1 tablet (600 mg total) by mouth every 6 (six) hours. 11/05/16   Hoover BrownsEma Kulwa, MD  oxyCODONE-acetaminophen (PERCOCET/ROXICET) 5-325 MG tablet Take 1 tablet by mouth every 4 (four) hours as needed for moderate pain. 11/05/16   Hoover BrownsEma Kulwa, MD  Prenatal MV-Min-FA-Omega-3 (PRENATAL GUMMIES/DHA & FA) 0.4-32.5 MG CHEW Chew 2 each by mouth daily.    Historical Provider, MD    Family History History reviewed. No pertinent family history.  Social History Social History  Substance Use Topics  . Smoking status: Never Smoker  . Smokeless tobacco: Never Used  . Alcohol  use No     Allergies   Fish allergy; Shellfish allergy; Banana; and Peanut-containing drug products   Review of Systems Review of Systems  Constitutional: Positive for fever.  HENT: Positive for congestion, rhinorrhea and sneezing.   Respiratory: Positive for cough.   All other systems reviewed and are negative.    Physical Exam Updated Vital Signs BP 128/65 (BP Location: Right Arm)   Pulse (!) 122   Temp 98.3 F (36.8 C) (Oral)   Resp 20   Ht 5\' 2"  (1.575 m)   Wt 135 lb (61.2 kg)   LMP  (LMP  Unknown)   SpO2 98%   Breastfeeding? Yes   BMI 24.69 kg/m   Physical Exam  Constitutional: She appears well-developed and well-nourished. No distress.  HENT:  Head: Normocephalic and atraumatic.  Maxillary sinus TTP, mild erythema to the posterior oropharynx, edematous nasal mucosa.   Eyes: Conjunctivae are normal.  Cardiovascular: Normal rate.   Pulmonary/Chest: Effort normal.  Abdominal: She exhibits no distension.  Neurological: She is alert.  Skin: Skin is warm and dry.  Psychiatric: She has a normal mood and affect.  Nursing note and vitals reviewed.    ED Treatments / Results  DIAGNOSTIC STUDIES:  Oxygen Saturation is 98% on RA, normal by my interpretation.    COORDINATION OF CARE:  10:33 PM Discussed treatment plan with pt at bedside and pt agreed to plan.   Labs (all labs ordered are listed, but only abnormal results are displayed) Labs Reviewed  PREGNANCY, URINE    EKG  EKG Interpretation None       Radiology Dg Chest 2 View  Result Date: 01/22/2017 CLINICAL DATA:  Cough x 3 days, fever today. h/o asthma. Pt sts she is unsure if she is pregnant, she is currently breast feeding a newborn that was born in December 2017. Shield was placed directly over abdomen appropriately for both x-rays, none were repeated. EXAM: CHEST - 2 VIEW COMPARISON:  none FINDINGS: Lungs are clear. Heart size and mediastinal contours are within normal limits. No effusion. Visualized bones unremarkable. IMPRESSION: No acute cardiopulmonary disease. Electronically Signed   By: Corlis Leak M.D.   On: 01/22/2017 19:36    Procedures Procedures (including critical care time)  Medications Ordered in ED Medications  ibuprofen (ADVIL,MOTRIN) tablet 800 mg (800 mg Oral Given 01/22/17 1913)     Initial Impression / Assessment and Plan / ED Course  I have reviewed the triage vital signs and the nursing notes.  Pertinent labs & imaging results that were available during my care of the  patient were reviewed by me and considered in my medical decision making (see chart for details).    Pt symptoms consistent with URI and acute sinusitis. CXR negative for acute infiltrate. Pt will be discharged with augmentin and supportive care instructions.  Discussed return precautions.  Pt is hemodynamically stable & in NAD prior to discharge.   Final Clinical Impressions(s) / ED Diagnoses   Final diagnoses:  Acute maxillary sinusitis, recurrence not specified    New Prescriptions New Prescriptions   AMOXICILLIN-CLAVULANATE (AUGMENTIN) 875-125 MG TABLET    Take 1 tablet by mouth 2 (two) times daily. One po bid x 7 days    I personally performed the services described in this documentation, which was scribed in my presence. The recorded information has been reviewed and is accurate.     Felicie Morn, NP 01/23/17 0023    Arby Barrette, MD 01/31/17 1700

## 2017-01-23 LAB — PREGNANCY, URINE: Preg Test, Ur: NEGATIVE

## 2017-01-23 NOTE — Discharge Instructions (Signed)
Take antibiotic as directed.  No known risk of infant harm with breast feeding, although your child may develop diarrhea, an itchy rash, thrush, or somnolence (reduced activity and increased sleepiness). You may use ibuprofen or tylenol for discomfort.

## 2017-03-13 ENCOUNTER — Emergency Department (HOSPITAL_BASED_OUTPATIENT_CLINIC_OR_DEPARTMENT_OTHER)
Admission: EM | Admit: 2017-03-13 | Discharge: 2017-03-13 | Disposition: A | Discharge status: Home / Self Care | Payer: BLUE CROSS/BLUE SHIELD | Attending: Emergency Medicine | Admitting: Emergency Medicine

## 2017-03-13 ENCOUNTER — Encounter (HOSPITAL_BASED_OUTPATIENT_CLINIC_OR_DEPARTMENT_OTHER): Payer: Self-pay | Admitting: Emergency Medicine

## 2017-03-13 DIAGNOSIS — J3089 Other allergic rhinitis: Secondary | ICD-10-CM

## 2017-03-13 DIAGNOSIS — J302 Other seasonal allergic rhinitis: Secondary | ICD-10-CM | POA: Insufficient documentation

## 2017-03-13 DIAGNOSIS — R0602 Shortness of breath: Secondary | ICD-10-CM

## 2017-03-13 DIAGNOSIS — Z79899 Other long term (current) drug therapy: Secondary | ICD-10-CM | POA: Insufficient documentation

## 2017-03-13 MED ORDER — METHYLPREDNISOLONE SODIUM SUCC 125 MG IJ SOLR
125.0000 mg | Freq: Once | INTRAMUSCULAR | Status: DC
  Filled 2017-03-13: qty 2

## 2017-03-13 MED ORDER — MAGNESIUM SULFATE 50 % IJ SOLN: 2.0000 g | Freq: Once | INTRAMUSCULAR | Status: DC

## 2017-03-13 MED ORDER — ALBUTEROL SULFATE HFA 108 (90 BASE) MCG/ACT IN AERS
1.0000 | INHALATION_SPRAY | Freq: Once | RESPIRATORY_TRACT | Status: AC
  Administered 2017-03-13: 1 via RESPIRATORY_TRACT
  Filled 2017-03-13: qty 6.7

## 2017-03-13 MED ORDER — ALBUTEROL SULFATE (2.5 MG/3ML) 0.083% IN NEBU
5.0000 mg | INHALATION_SOLUTION | Freq: Once | RESPIRATORY_TRACT | Status: AC
  Administered 2017-03-13: 5 mg via RESPIRATORY_TRACT
  Filled 2017-03-13: qty 6

## 2017-03-13 MED ORDER — LORATADINE 10 MG PO TABS: 10.0000 mg | ORAL_TABLET | Freq: Every day | ORAL | 0 refills | Status: DC

## 2017-03-13 MED ORDER — MAGNESIUM SULFATE 2 GM/50ML IV SOLN
2.0000 g | Freq: Once | INTRAVENOUS | Status: DC
Start: 2017-03-13 — End: 2017-03-14
  Filled 2017-03-13: qty 50

## 2017-03-13 NOTE — Discharge Instructions (Signed)
Claritin for allergy symptoms. Albuterol inhaler as needed for shortness of breath. Follow-up with your primary care for reevaluation of your symptoms and medication management. Return to the ED if any concerning symptoms develop.

## 2017-03-13 NOTE — ED Provider Notes (Signed)
[facility] Provider Note   CSN: [id number] Arrival date & time: 03/13/17  2013   By signing my name below, I, Soijett Blue, attest that this documentation has been prepared under the direction and in the presence of Michela PitcherMina Zayana Salvador, PA-C Electronically Signed: Soijett Blue, ED Scribe. 03/13/17. 9:34 PM.  History   Chief Complaint Chief Complaint  Patient presents with  . Shortness of Breath    HPI [name] is a 21 y.o. female with a PMHx of asthma, who presents to the Emergency Department complaining of gradually worsening SOB onset 2.5 hours ago. Pt reports that she ran out of her albuterol inhaler and that is why she came into the ED for evaluation of her symptoms. Pt has not tried any medications for the relief of her symptoms. She notes that she uses her albuterol inhaler 2-3 times a week during the summer months. She denies CP, HA, dizziness, LOC, abdominal pain, nausea, vomiting, fever, chills, and any other symptoms. [facility] PCP [facility] at [facility].   The history is provided by the patient. No language interpreter was used.    Past Medical History:  Diagnosis Date  . Asthma   . Chlamydia   . Eczema   . GERD (gastroesophageal reflux disease)   . HSV (herpes simplex virus) infection    history of-no current lesions  . Pyelonephritis complicating pregnancy, antepartum 07/17/2014    Patient Active Problem List   Diagnosis Date Noted  . Indication for care or intervention in labor or delivery 11/02/2016  . Status post primary low transverse cesarean section 10/12/2014  . Normal labor 10/09/2014  . 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 Multiple food allergies--banana, fish oil, peanut 10/09/2014  . Asthma, chronic 10/09/2014  . Eczema 10/09/2014  . GBS (group B streptococcus) UTI complicating pregnancy--07/16/14 08/07/2014  . Short cervix 07/18/2014  . Pyelonephritis complicating pregnancy, antepartum 07/17/2014    Past Surgical History:  Procedure Laterality  Date  . CESAREAN SECTION N/A 10/10/2014   Procedure: CESAREAN SECTION;  Surgeon: Purcell NailsAngela Y Roberts, MD;  Location: [facility];  Service: Obstetrics;  Laterality: N/A;  . CESAREAN SECTION N/A 11/02/2016   Procedure: CESAREAN SECTION;  Surgeon: Jaymes GraffNaima Dillard, MD;  Location: [facility];  Service: Obstetrics;  Laterality: N/A;  . HERNIA REPAIR    . NO PAST SURGERIES      OB History    Gravida Para Term Preterm AB Living   2 2 2     2    SAB TAB Ectopic Multiple Live Births         0 2       Home Medications    Prior to Admission medications   Medication Sig Start Date End Date Taking? Authorizing Provider  albuterol (PROAIR HFA) 108 (90 Base) MCG/ACT inhaler Inhale 2 puffs into the lungs every 6 (six) hours as needed for wheezing or shortness of breath. 09/05/16   Tegeler, Canary Brimhristopher J, MD  amoxicillin-clavulanate (AUGMENTIN) 875-125 MG tablet Take 1 tablet by mouth 2 (two) times daily. One po bid x 7 days 01/22/17   Felicie MornSmith, David, NP  ibuprofen (ADVIL,MOTRIN) 600 MG tablet Take 1 tablet (600 mg total) by mouth every 6 (six) hours. 11/05/16   Hoover BrownsKulwa, Ema, MD  loratadine (CLARITIN) 10 MG tablet Take 1 tablet (10 mg total) by mouth daily. 03/13/17   Lerone Onder A, PA-C  oxyCODONE-acetaminophen (PERCOCET/ROXICET) 5-325 MG tablet Take 1 tablet by mouth every 4 (four) hours as needed for moderate pain. 11/05/16   Sallye OberKulwa,  [place], MD  Prenatal MV-Min-FA-Omega-3 (PRENATAL GUMMIES/DHA & FA) 0.4-32.5 MG CHEW Chew 2 each by mouth daily.    [provider]    Family History History reviewed. No pertinent family history.  Social History Social History  Substance Use Topics  . Smoking status: Never Smoker  . Smokeless tobacco: Never Used  . Alcohol use No     Allergies   Fish allergy; Shellfish allergy; Banana; and Peanut-containing drug products   Review of Systems Review of Systems  Constitutional: Negative for chills and fever.  Respiratory: Positive for shortness of breath.     Cardiovascular: Negative for chest pain.  Gastrointestinal: Negative for abdominal pain, nausea and vomiting.  Neurological: Negative for dizziness, syncope and headaches.     Physical Exam Updated Vital Signs BP (!) 109/53 (BP Location: Right Arm)   Pulse 87   Temp 98.8 F (37.1 C) (Oral)   Resp (!) 22   Ht 5\' 2"  (1.575 m)   Wt 140 lb (63.5 kg)   LMP 03/13/2017   SpO2 99%   BMI 25.61 kg/m   Physical Exam  Constitutional: She is oriented to person, place, and time. She appears well-developed and well-nourished. No distress.  HENT:  Head: Normocephalic and atraumatic.  Right Ear: Tympanic membrane, external ear and ear canal normal. Tympanic membrane is not erythematous and not bulging.  Left Ear: Tympanic membrane, external ear and ear canal normal. Tympanic membrane is not erythematous and not bulging.  Nose: No septal deviation.  Mouth/Throat: Uvula is midline, oropharynx is clear and moist and mucous membranes are normal.  Serous fluid behind TMs bilaterally. No bulging or erythema. Nasal septum midline with pale pink boggy mucosa.   Eyes: EOM are normal.  Neck: Neck supple.  Cardiovascular: Normal rate, regular rhythm and normal heart sounds.  Exam reveals no gallop and no friction rub.   No murmur heard. Pulmonary/Chest: Effort normal and breath sounds normal. No respiratory distress. She has no wheezes. She has no rales.  Abdominal: She exhibits no distension.  Musculoskeletal: Normal range of motion.  Neurological: She is alert and oriented to person, place, and time.  Skin: Skin is warm and dry.  Psychiatric: She has a normal mood and affect. Her behavior is normal.  Nursing note and vitals reviewed.    ED Treatments / Results  DIAGNOSTIC STUDIES: Oxygen Saturation is 99% on RA, nl by my interpretation.    COORDINATION OF CARE: 9:29 PM Discussed treatment plan with pt at bedside which includes breathing treatment and pt agreed to  plan.   Procedures Procedures (including critical care time)  Medications Ordered in ED Medications  albuterol (PROVENTIL) (2.5 MG/3ML) 0.083% nebulizer solution 5 mg (5 mg Nebulization Given 03/13/17 2030)  albuterol (PROVENTIL HFA;VENTOLIN HFA) 108 (90 Base) MCG/ACT inhaler 1 puff (1 puff Inhalation Given 03/13/17 2222)     Initial Impression / Assessment and Plan / ED Course  I have reviewed the triage vital signs and the nursing notes.  Patient with history of allergies and asthma presents with shortness of breath which began earlier this evening and did not have her albuterol inhaler. Afebrile, vital signs are stable. She received epidural nebulizer prior to my examination and states shortness of breath has resolved. No wheezing on auscultation of lungs on my examination. Remainder of physical examination consistent with seasonal allergies. Patient with no complaints at this time and in no apparent distress. Suspect worsening allergies causing an asthma exacerbation. Patient given albuterol inhaler to take home, recommend follow-up with  primary care for management of her allergies. She does not take any allergy medications, given Rx for Claritin. Discussed strict ED return precautions. Pt verbalized understanding of and agreement with plan and is safe for discharge home at this time.   Final Clinical Impressions(s) / ED Diagnoses   Final diagnoses:  SOB (shortness of breath)  Environmental and seasonal allergies    New Prescriptions Discharge Medication List as of 03/13/2017  9:34 PM    START taking these medications   Details  loratadine (CLARITIN) 10 MG tablet Take 1 tablet (10 mg total) by mouth daily., Starting Fri 03/13/2017, Print       I personally performed the services described in this documentation, which was scribed in my presence. The recorded information has been reviewed and is accurate.     Jeanie Sewer, PA-C 03/14/17 1709    Nira Conn,  MD 03/17/17 1500

## 2017-03-13 NOTE — ED Triage Notes (Signed)
Patient states that she became SOb about an hour ago, she was going to use her inhaler but it was empty

## 2017-03-13 NOTE — Progress Notes (Signed)
RN administered MDI

## 2017-04-22 ENCOUNTER — Encounter (HOSPITAL_BASED_OUTPATIENT_CLINIC_OR_DEPARTMENT_OTHER): Payer: Self-pay

## 2017-04-22 ENCOUNTER — Emergency Department (HOSPITAL_BASED_OUTPATIENT_CLINIC_OR_DEPARTMENT_OTHER)
Admission: EM | Admit: 2017-04-22 | Discharge: 2017-04-22 | Disposition: A | Discharge status: Home / Self Care | Payer: Self-pay | Attending: Emergency Medicine | Admitting: Emergency Medicine

## 2017-04-22 DIAGNOSIS — Z79899 Other long term (current) drug therapy: Secondary | ICD-10-CM | POA: Insufficient documentation

## 2017-04-22 DIAGNOSIS — J019 Acute sinusitis, unspecified: Secondary | ICD-10-CM | POA: Insufficient documentation

## 2017-04-22 DIAGNOSIS — J45909 Unspecified asthma, uncomplicated: Secondary | ICD-10-CM | POA: Insufficient documentation

## 2017-04-22 DIAGNOSIS — Z91010 Allergy to peanuts: Secondary | ICD-10-CM | POA: Insufficient documentation

## 2017-04-22 DIAGNOSIS — J0101 Acute recurrent maxillary sinusitis: Secondary | ICD-10-CM

## 2017-04-22 MED ORDER — AMOXICILLIN 500 MG PO CAPS: 500.0000 mg | ORAL_CAPSULE | Freq: Three times a day (TID) | ORAL | 0 refills | Status: DC

## 2017-04-22 NOTE — Discharge Instructions (Signed)
Amoxicillin as prescribed.  Continue over-the-counter decongestants as needed.  Follow-up with your primary Dr. if not improving in the next week.

## 2017-04-22 NOTE — ED Provider Notes (Signed)
[facility]   CSN: [id number] Arrival date & time: 04/22/17  1306     History   Chief Complaint Chief Complaint  Patient presents with  . Nasal Congestion    HPI Cheryl Logan is a 21 y.o. female.  Patient is a 21 year old female who presents with complaints of nasal congestion that has been ongoing for several weeks. She is now experiencing facial pain and frontal head pain and pressure consistent with prior sinus infections.   The history is provided by the patient.  URI   This is a new problem. There has been no fever. Associated symptoms include headaches and sinus pain. She has tried nothing for the symptoms. The treatment provided no relief.    Past Medical History:  Diagnosis Date  . Asthma   . Chlamydia   . Eczema   . GERD (gastroesophageal reflux disease)   . HSV (herpes simplex virus) infection    history of-no current lesions  . Pyelonephritis complicating pregnancy, antepartum 07/17/2014    Patient Active Problem List   Diagnosis Date Noted  . Indication for care or intervention in labor or delivery 11/02/2016  . Status post primary low transverse cesarean section 10/12/2014  . Normal labor 10/09/2014  . 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 Multiple food allergies--banana, fish oil, peanut 10/09/2014  . Asthma, chronic 10/09/2014  . Eczema 10/09/2014  . GBS (group B streptococcus) UTI complicating pregnancy--07/16/14 08/07/2014  . Short cervix 07/18/2014  . Pyelonephritis complicating pregnancy, antepartum 07/17/2014    Past Surgical History:  Procedure Laterality Date  . CESAREAN SECTION N/A 10/10/2014   Procedure: CESAREAN SECTION;  Surgeon: Purcell NailsAngela Y Roberts, MD;  Location: [facility];  Service: Obstetrics;  Laterality: N/A;  . CESAREAN SECTION N/A 11/02/2016   Procedure: CESAREAN SECTION;  Surgeon: Jaymes GraffNaima Dillard, MD;  Location: [facility];  Service: Obstetrics;  Laterality: N/A;  . HERNIA REPAIR    . NO PAST SURGERIES       OB History    Gravida Para Term Preterm AB Living   2 2 2     2    SAB TAB Ectopic Multiple Live Births         0 2       Home Medications    Prior to Admission medications   Medication Sig Start Date End Date Taking? Authorizing Provider  albuterol (PROAIR HFA) 108 (90 Base) MCG/ACT inhaler Inhale 2 puffs into the lungs every 6 (six) hours as needed for wheezing or shortness of breath. 09/05/16   Tegeler, Canary Brimhristopher J, MD  loratadine (CLARITIN) 10 MG tablet Take 1 tablet (10 mg total) by mouth daily. 03/13/17   Jeanie SewerFawze, Mina A, PA-C    Family History No family history on file.  Social History Social History  Substance Use Topics  . Smoking status: Never Smoker  . Smokeless tobacco: Never Used  . Alcohol use No     Allergies   Fish allergy; Shellfish allergy; Banana; and Peanut-containing drug products   Review of Systems Review of Systems  HENT: Positive for sinus pain.   Neurological: Positive for headaches.  All other systems reviewed and are negative.    Physical Exam Updated Vital Signs BP 111/77 (BP Location: Left Arm)   Pulse (!) 104   Temp 98.3 F (36.8 C) (Oral)   Resp 17   Ht 5\' 2"  (1.575 m)   Wt 64.3 kg (141 lb 12.8 oz)   LMP 04/01/2017   SpO2 100%   BMI 25.94 kg/m  Physical Exam  Constitutional: She is oriented to person, place, and time. She appears well-developed and well-nourished. No distress.  HENT:  Head: Normocephalic and atraumatic.  Mouth/Throat: Oropharynx is clear and moist.  There is frontal and maxillofacial sinus pressure and tenderness.  Neck: Normal range of motion. Neck supple.  Cardiovascular: Normal rate and regular rhythm.  Exam reveals no gallop and no friction rub.   No murmur heard. Pulmonary/Chest: Effort normal and breath sounds normal. No respiratory distress. She has no wheezes.  Abdominal: Soft. Bowel sounds are normal. She exhibits no distension. There is no tenderness.  Musculoskeletal: Normal range of  motion.  Neurological: She is alert and oriented to person, place, and time.  Skin: Skin is warm and dry. She is not diaphoretic.  Nursing note and vitals reviewed.    ED Treatments / Results  Labs (all labs ordered are listed, but only abnormal results are displayed) Labs Reviewed - No data to display  EKG  EKG Interpretation None       Radiology No results found.  Procedures Procedures (including critical care time)  Medications Ordered in ED Medications - No data to display   Initial Impression / Assessment and Plan / ED Course  I have reviewed the triage vital signs and the nursing notes.  Pertinent labs & imaging results that were available during my care of the patient were reviewed by me and considered in my medical decision making (see chart for details).  Patient with a several week history of congestion now having facial pain and pressure consistent with prior sinus infections. She will be treated with amoxicillin and advised to follow-up as needed.  Final Clinical Impressions(s) / ED Diagnoses   Final diagnoses:  None    New Prescriptions New Prescriptions   No medications on file     Geoffery Lyons, MD 04/22/17 1337

## 2017-04-22 NOTE — ED Triage Notes (Signed)
C/o sinus congestion/pain x 2-3 days-NAD-steady gait

## 2017-05-14 ENCOUNTER — Encounter (HOSPITAL_BASED_OUTPATIENT_CLINIC_OR_DEPARTMENT_OTHER): Payer: Self-pay | Admitting: *Deleted

## 2017-05-14 ENCOUNTER — Emergency Department (HOSPITAL_BASED_OUTPATIENT_CLINIC_OR_DEPARTMENT_OTHER)
Admission: EM | Admit: 2017-05-14 | Discharge: 2017-05-14 | Disposition: A | Discharge status: Home / Self Care | Payer: BLUE CROSS/BLUE SHIELD | Attending: Emergency Medicine | Admitting: Emergency Medicine

## 2017-05-14 DIAGNOSIS — Z79899 Other long term (current) drug therapy: Secondary | ICD-10-CM | POA: Diagnosis not present

## 2017-05-14 DIAGNOSIS — J45909 Unspecified asthma, uncomplicated: Secondary | ICD-10-CM | POA: Diagnosis not present

## 2017-05-14 DIAGNOSIS — J029 Acute pharyngitis, unspecified: Secondary | ICD-10-CM | POA: Diagnosis present

## 2017-05-14 DIAGNOSIS — Z91010 Allergy to peanuts: Secondary | ICD-10-CM | POA: Diagnosis not present

## 2017-05-14 LAB — RAPID STREP SCREEN (MED CTR MEBANE ONLY): Streptococcus, Group A Screen (Direct): NEGATIVE

## 2017-05-14 MED ORDER — DEXAMETHASONE SODIUM PHOSPHATE 10 MG/ML IJ SOLN
10.0000 mg | Freq: Once | INTRAMUSCULAR | Status: AC
  Administered 2017-05-14: 10 mg via INTRAMUSCULAR
  Filled 2017-05-14: qty 1

## 2017-05-14 MED ORDER — FLUTICASONE PROPIONATE 50 MCG/ACT NA SUSP: 1.0000 | Freq: Every day | NASAL | 0 refills | Status: DC

## 2017-05-14 MED FILL — FLUTICASONE PROP 50 MCG SPR: 50 | 30 days supply | Qty: 16 | Fill #0

## 2017-05-14 NOTE — Discharge Instructions (Signed)
Please read and follow all provided instructions.  Your diagnoses today include:  1. Viral pharyngitis    Tests performed today include: Vital signs. See below for your results today.   Medications prescribed:  Take as prescribed   Home care instructions:  Follow any educational materials contained in this packet.  Follow-up instructions: Please follow-up with your primary care provider for further evaluation of symptoms and treatment   Return instructions:  Please return to the Emergency Department if you do not get better, if you get worse, or new symptoms OR  - Fever (temperature greater than 101.104F)  - Bleeding that does not stop with holding pressure to the area    -Severe pain (please note that you may be more sore the day after your accident)  - Chest Pain  - Difficulty breathing  - Severe nausea or vomiting  - Inability to tolerate food and liquids  - Passing out  - Skin becoming red around your wounds  - Change in mental status (confusion or lethargy)  - New numbness or weakness    Please return if you have any other emergent concerns.  Additional Information:  Your vital signs today were: BP 98/67    Pulse (!) 104    Temp 99.3 F (37.4 C) (Oral)    Resp 20    Ht 5\' 2"  (1.575 m)    Wt 64 kg (141 lb)    LMP 04/30/2017    SpO2 100%    BMI 25.79 kg/m  If your blood pressure (BP) was elevated above 135/85 this visit, please have this repeated by your doctor within one month. ---------------

## 2017-05-14 NOTE — ED Triage Notes (Signed)
Sore throat, headache and fever since yesterday. She was exposed to strep.

## 2017-05-14 NOTE — ED Provider Notes (Signed)
[facility] Provider Note   CSN: [id number] Arrival date & time: 05/14/17  1446     History   Chief Complaint Chief Complaint  Patient presents with  . Sore Throat    HPI [name] Cindra Presumeorrence is a 21 y.o. female.  HPI  21 y.o. female, presents to the Emergency Department today due to sore throat since yesterday. No fevers. NO N/V. No Cp/SOB. Noted rhinorrhea and sinus pressure. No ear aches. No pain currently. Seen last week and given Rx Amoxicillin due to possible recurrent sinusitis. No meds PTA. No other symptoms noted.    Past Medical History:  Diagnosis Date  . Asthma   . Chlamydia   . Eczema   . GERD (gastroesophageal reflux disease)   . HSV (herpes simplex virus) infection    history of-no current lesions  . Pyelonephritis complicating pregnancy, antepartum 07/17/2014    Patient Active Problem List   Diagnosis Date Noted  . Indication for care or intervention in labor or delivery 11/02/2016  . Status post primary low transverse cesarean section 10/12/2014  . Normal labor 10/09/2014  . 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 Multiple food allergies--banana, fish oil, peanut 10/09/2014  . Asthma, chronic 10/09/2014  . Eczema 10/09/2014  . GBS (group B streptococcus) UTI complicating pregnancy--07/16/14 08/07/2014  . Short cervix 07/18/2014  . Pyelonephritis complicating pregnancy, antepartum 07/17/2014    Past Surgical History:  Procedure Laterality Date  . CESAREAN SECTION N/A 10/10/2014   Procedure: CESAREAN SECTION;  Surgeon: Purcell NailsAngela Y Roberts, MD;  Location: [facility];  Service: Obstetrics;  Laterality: N/A;  . CESAREAN SECTION N/A 11/02/2016   Procedure: CESAREAN SECTION;  Surgeon: Jaymes GraffNaima Dillard, MD;  Location: [facility];  Service: Obstetrics;  Laterality: N/A;  . HERNIA REPAIR    . NO PAST SURGERIES      OB History    Gravida Para Term Preterm AB Living   2 2 2     2    SAB TAB Ectopic Multiple Live Births         0 2       Home Medications      Prior to Admission medications   Medication Sig Start Date End Date Taking? Authorizing Provider  albuterol (PROAIR HFA) 108 (90 Base) MCG/ACT inhaler Inhale 2 puffs into the lungs every 6 (six) hours as needed for wheezing or shortness of breath. 09/05/16  Yes Tegeler, Canary Brimhristopher J, MD  loratadine (CLARITIN) 10 MG tablet Take 1 tablet (10 mg total) by mouth daily. 03/13/17  Yes Fawze, Mina A, PA-C  amoxicillin (AMOXIL) 500 MG capsule Take 1 capsule (500 mg total) by mouth 3 (three) times daily. 04/22/17   Geoffery Lyonselo, Douglas, MD    Family History No family history on file.  Social History Social History  Substance Use Topics  . Smoking status: Never Smoker  . Smokeless tobacco: Never Used  . Alcohol use No     Allergies   Fish allergy; Shellfish allergy; Banana; and Peanut-containing drug products   Review of Systems Review of Systems  Constitutional: Negative for fever.  HENT: Positive for congestion and sore throat.   Respiratory: Negative for shortness of breath.   Cardiovascular: Negative for chest pain.  Gastrointestinal: Negative for nausea and vomiting.     Physical Exam Updated Vital Signs BP 98/67   Pulse (!) 104   Temp 99.3 F (37.4 C) (Oral)   Resp 20   Ht 5\' 2"  (1.575 m)   Wt 64 kg (141 lb)   LMP  04/30/2017   SpO2 100%   BMI 25.79 kg/m   Physical Exam  Constitutional: She is oriented to person, place, and time. She appears well-developed and well-nourished. No distress.  HENT:  Head: Normocephalic and atraumatic.  Right Ear: Tympanic membrane, external ear and ear canal normal.  Left Ear: Tympanic membrane, external ear and ear canal normal.  Nose: Nose normal.  Mouth/Throat: Uvula is midline, oropharynx is clear and moist and mucous membranes are normal. No trismus in the jaw. No oropharyngeal exudate, posterior oropharyngeal erythema or tonsillar abscesses.  Posterior oropharynx without erythema. Mild exudate. No uvula deviation. No trismus.   Eyes:  Pupils are equal, round, and reactive to light. EOM are normal.  Neck: Normal range of motion. Neck supple. No tracheal deviation present.  Cardiovascular: Normal rate, regular rhythm, S1 normal, S2 normal, normal heart sounds, intact distal pulses and normal pulses.   Pulmonary/Chest: Effort normal and breath sounds normal. No respiratory distress. She has no decreased breath sounds. She has no wheezes. She has no rhonchi. She has no rales.  Abdominal: Normal appearance and bowel sounds are normal. There is no tenderness.  Musculoskeletal: Normal range of motion.  Neurological: She is alert and oriented to person, place, and time.  Skin: Skin is warm and dry.  Psychiatric: She has a normal mood and affect. Her speech is normal and behavior is normal. Thought content normal.     ED Treatments / Results  Labs (all labs ordered are listed, but only abnormal results are displayed) Labs Reviewed  RAPID STREP SCREEN (NOT AT [facility])  CULTURE, GROUP A STREP [facility])    EKG  EKG Interpretation None       Radiology No results found.  Procedures Procedures (including critical care time)  Medications Ordered in ED Medications  dexamethasone (DECADRON) injection 10 mg (not administered)     Initial Impression / Assessment and Plan / ED Course  I have reviewed the triage vital signs and the nursing notes.  Pertinent labs & imaging results that were available during my care of the patient were reviewed by me and considered in my medical decision making (see chart for details).  Final Clinical Impressions(s) / ED Diagnoses  {I have reviewed and evaluated the relevant laboratory values.   {I have reviewed the relevant previous healthcare records.  {I obtained HPI from historian.   ED Course:  Assessment: Pt is a 21 y.o. female presents with sore throats since yesterday. On exam, pt in NAD. VSS. Afebrile. Lungs CTA, Heart RRR. Posterior oropharynx unremarkable. Rapid strep negative.  Patients symptoms are consistent with URI, likely viral etiology. Discussed that antibiotics are not indicated for viral infections. Pt will be discharged with symptomatic treatment.  Verbalizes understanding and is agreeable with plan. Pt is hemodynamically stable & in NAD prior to dc  Disposition/Plan:  DC Home Additional Verbal discharge instructions given and discussed with patient.  Pt Instructed to f/u with PCP in the next week for evaluation and treatment of symptoms. Return precautions given Pt acknowledges and agrees with plan  Supervising Physician Lavera Guise, MD  Final diagnoses:  Viral pharyngitis    New Prescriptions New Prescriptions   No medications on file     Audry Pili, Cordelia Poche 05/14/17 1544    Lavera Guise, MD 05/15/17 0010

## 2017-05-14 NOTE — Progress Notes (Signed)
Pt has 4 visits in the last 6 months with only OB/GYN PCP care listed and no ins.  Provided pt with [facility] or [facility] options for follow up care to establish PCP on AVS at time of D/C.  No additional CM needs noted at this time.

## 2017-05-17 LAB — CULTURE, GROUP A STREP (THRC)

## 2018-02-19 ENCOUNTER — Other Ambulatory Visit: Payer: Self-pay

## 2018-02-19 ENCOUNTER — Encounter (HOSPITAL_BASED_OUTPATIENT_CLINIC_OR_DEPARTMENT_OTHER): Payer: Self-pay | Admitting: Adult Health

## 2018-02-19 ENCOUNTER — Emergency Department (HOSPITAL_BASED_OUTPATIENT_CLINIC_OR_DEPARTMENT_OTHER): Payer: Self-pay

## 2018-02-19 ENCOUNTER — Emergency Department (HOSPITAL_BASED_OUTPATIENT_CLINIC_OR_DEPARTMENT_OTHER)
Admission: EM | Admit: 2018-02-19 | Discharge: 2018-02-20 | Disposition: A | Discharge status: Home / Self Care | Payer: Self-pay | Attending: Physician Assistant | Admitting: Physician Assistant

## 2018-02-19 DIAGNOSIS — Z91010 Allergy to peanuts: Secondary | ICD-10-CM | POA: Insufficient documentation

## 2018-02-19 DIAGNOSIS — J45901 Unspecified asthma with (acute) exacerbation: Secondary | ICD-10-CM | POA: Insufficient documentation

## 2018-02-19 DIAGNOSIS — Z79899 Other long term (current) drug therapy: Secondary | ICD-10-CM | POA: Insufficient documentation

## 2018-02-19 MED ORDER — ALBUTEROL SULFATE (2.5 MG/3ML) 0.083% IN NEBU
INHALATION_SOLUTION | RESPIRATORY_TRACT | Status: AC
  Administered 2018-02-19: 2.5 mg
  Filled 2018-02-19: qty 3

## 2018-02-19 MED ORDER — IPRATROPIUM-ALBUTEROL 0.5-2.5 (3) MG/3ML IN SOLN
RESPIRATORY_TRACT | Status: AC
  Administered 2018-02-19: 3 mL
  Filled 2018-02-19: qty 3

## 2018-02-19 MED ORDER — ALBUTEROL SULFATE HFA 108 (90 BASE) MCG/ACT IN AERS
2.0000 | INHALATION_SPRAY | Freq: Once | RESPIRATORY_TRACT | Status: AC
  Administered 2018-02-19: 2 via RESPIRATORY_TRACT
  Filled 2018-02-19: qty 6.7

## 2018-02-19 MED ORDER — DEXAMETHASONE SODIUM PHOSPHATE 10 MG/ML IJ SOLN
10.0000 mg | Freq: Once | INTRAMUSCULAR | Status: AC
  Administered 2018-02-19: 10 mg via INTRAMUSCULAR
  Filled 2018-02-19: qty 1

## 2018-02-19 NOTE — ED Notes (Signed)
Patient transported to X-ray 

## 2018-02-19 NOTE — ED Notes (Signed)
ED Provider at bedside. 

## 2018-02-19 NOTE — Discharge Instructions (Signed)
Please continue to use your albuterol inhaler every 4-6 hours for the next 24 hours and then as needed, you were given steroids here in the ED that will continue to work over the next 4-5 days.  Your chest x-ray did not show any signs of pneumonia.  Please follow-up with your primary care doctor early next week.  Return to the ED for any difficulty breathing, shortness of breath increased work of breathing, persistent fevers and worsening cough or any other new or concerning symptoms.

## 2018-02-19 NOTE — ED Provider Notes (Signed)
[facility] EMERGENCY DEPARTMENT Provider Note   CSN: [id number] Arrival date & time: 02/19/18  2008     History   Chief Complaint Chief Complaint  Patient presents with  . Asthma    HPI [name] Hritz is a 22 y.o. female.  [name] Pizano is a 22 y.o. Female with history of asthma, eczema, GERD and HSV, who presents to the ED for evaluation of shortness of breath and wheezing for the past few days.  Patient reports she feels like she is having an asthma exacerbation that started a few days ago, she reports cough frequently productive of yellow mucus, chest tightness and wheezing that is worse at night.  She reports she ran out of her inhaler when the symptoms started a few days ago, was prescribed a daily maintenance medication for her asthma but has been unable to afford it.  She reports with worsening pollen in the ear, she has had sinus congestion and nasal drainage which is a common trigger for her asthma she is not been taking any medications for her allergies.  She reports persistent chest tightness and wheezing but denies chest pain.  No fevers or chills.  No abdominal pain, nausea or vomiting.  Her 30-year-old son is here with similar symptoms.     Past Medical History:  Diagnosis Date  . Asthma   . Chlamydia   . Eczema   . GERD (gastroesophageal reflux disease)   . HSV (herpes simplex virus) infection    history of-no current lesions  . Pyelonephritis complicating pregnancy, antepartum 07/17/2014    Patient Active Problem List   Diagnosis Date Noted  . Indication for care or intervention in labor or delivery 11/02/2016  . Status post primary low transverse cesarean section 10/12/2014  . Normal labor 10/09/2014  . 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 Multiple food allergies--banana, fish oil, peanut 10/09/2014  . Asthma, chronic 10/09/2014  . Eczema 10/09/2014  . GBS (group B streptococcus) UTI complicating pregnancy--07/16/14 08/07/2014  . Short cervix  07/18/2014  . Pyelonephritis complicating pregnancy, antepartum 07/17/2014    Past Surgical History:  Procedure Laterality Date  . CESAREAN SECTION N/A 10/10/2014   Procedure: CESAREAN SECTION;  Surgeon: Purcell Nails, MD;  Location: [facility];  Service: Obstetrics;  Laterality: N/A;  . CESAREAN SECTION N/A 11/02/2016   Procedure: CESAREAN SECTION;  Surgeon: Jaymes Graff, MD;  Location: [facility];  Service: Obstetrics;  Laterality: N/A;  . HERNIA REPAIR    . NO PAST SURGERIES       OB History    Gravida  2   Para  2   Term  2   Preterm      AB      Living  2     SAB      TAB      Ectopic      Multiple  0   Live Births  2            Home Medications    Prior to Admission medications   Medication Sig Start Date End Date Taking? Authorizing Provider  albuterol (PROAIR HFA) 108 (90 Base) MCG/ACT inhaler Inhale 2 puffs into the lungs every 6 (six) hours as needed for wheezing or shortness of breath. 09/05/16   Tegeler, Canary Brim, MD  amoxicillin (AMOXIL) 500 MG capsule Take 1 capsule (500 mg total) by mouth 3 (three) times daily. 04/22/17   Geoffery Lyons, MD  fluticasone (FLONASE) 50 MCG/ACT nasal spray Place 1 spray into both  nostrils daily. 05/14/17   Audry Pili, PA-C  loratadine (CLARITIN) 10 MG tablet Take 1 tablet (10 mg total) by mouth daily. 03/13/17   Jeanie Sewer, PA-C    Family History History reviewed. No pertinent family history.  Social History Social History   Tobacco Use  . Smoking status: Never Smoker  . Smokeless tobacco: Never Used  Substance Use Topics  . Alcohol use: No  . Drug use: No     Allergies   Fish allergy; Shellfish allergy; Banana; and Peanut-containing drug products   Review of Systems Review of Systems  Constitutional: Negative for chills and fever.  HENT: Positive for congestion, postnasal drip and rhinorrhea. Negative for ear pain and sore throat.   Eyes: Negative for discharge, redness and itching.    Respiratory: Positive for cough, chest tightness, shortness of breath and wheezing.   Cardiovascular: Negative for chest pain.  Gastrointestinal: Negative for abdominal pain, nausea and vomiting.  Skin: Negative for color change and rash.  Neurological: Negative for dizziness, light-headedness and headaches.  All other systems reviewed and are negative.    Physical Exam Updated Vital Signs BP 108/75 (BP Location: Right Arm)   Pulse (!) 109   Temp 98.9 F (37.2 C) (Oral)   Resp (!) 22   Ht 5\' 2"  (1.575 m)   Wt 77.1 kg (170 lb)   LMP 02/01/2018 (Exact Date)   SpO2 97%   BMI 31.09 kg/m   Physical Exam  Constitutional: She appears well-developed and well-nourished. No distress.  HENT:  Head: Normocephalic and atraumatic.  TMs clear with good landmarks, moderate nasal mucosa edema with clear rhinorrhea, posterior oropharynx clear and moist, with minimal erythema, no edema or exudates  Eyes: Right eye exhibits no discharge. Left eye exhibits no discharge.  Neck: Normal range of motion. Neck supple.  No stridor  Cardiovascular: Normal rate, regular rhythm, normal heart sounds and intact distal pulses.  Pulmonary/Chest: Effort normal. No stridor. No respiratory distress. She has wheezes. She has no rales. She exhibits no tenderness.  Respirations are equal and unlabored, patient able to speak in full sentences, few scattered end expiratory wheezes present after 1 albuterol breathing treatment, occasional cough during exam  Abdominal: Soft. Bowel sounds are normal. She exhibits no distension and no mass. There is no tenderness. There is no guarding.  Neurological: She is alert. Coordination normal.  Skin: Skin is warm and dry. Capillary refill takes less than 2 seconds. She is not diaphoretic.  Psychiatric: She has a normal mood and affect. Her behavior is normal.  Nursing note and vitals reviewed.    ED Treatments / Results  Labs (all labs ordered are listed, but only abnormal  results are displayed) Labs Reviewed - No data to display  EKG None  Radiology Dg Chest 2 View  Result Date: 02/19/2018 CLINICAL DATA:  Initial evaluation for acute cough, wheezing, asthma exacerbation. EXAM: CHEST - 2 VIEW COMPARISON:  Prior radiograph from 01/22/2017. FINDINGS: The cardiac and mediastinal silhouettes are stable in size and contour, and remain within normal limits. The lungs are normally inflated. Mild scattered peribronchial thickening, suspected be related history of asthma. No airspace consolidation, pleural effusion, or pulmonary edema is identified. There is no pneumothorax. No acute osseous abnormality identified. IMPRESSION: 1. Mild scattered peribronchial thickening, suspected to be related history of asthma. 2. No other active cardiopulmonary disease. Electronically Signed   By: Rise Mu M.D.   On: 02/19/2018 23:04    Procedures Procedures (including critical care time)  Medications  Ordered in ED Medications  albuterol (PROVENTIL) (2.5 MG/3ML) 0.083% nebulizer solution (2.5 mg  Given 02/19/18 2028)  ipratropium-albuterol (DUONEB) 0.5-2.5 (3) MG/3ML nebulizer solution (3 mLs  Given 02/19/18 2028)  dexamethasone (DECADRON) injection 10 mg (10 mg Intramuscular Given 02/19/18 2300)  albuterol (PROVENTIL HFA;VENTOLIN HFA) 108 (90 Base) MCG/ACT inhaler 2 puff (2 puffs Inhalation Given 02/19/18 2312)     Initial Impression / Assessment and Plan / ED Course  I have reviewed the triage vital signs and the nursing notes.  Pertinent labs & imaging results that were available during my care of the patient were reviewed by me and considered in my medical decision making (see chart for details).  Patient presents to the ED for evaluation of cough, wheezing and shortness of breath, in line with her usual asthma exacerbation, commonly triggered by allergies.  Patient recently ran out of her albuterol inhaler.  On arrival patient mildly tachycardic and tachypneic, lungs  were tight with a few end expiratory wheezes.  Patient received albuterol and Atrovent with improvement.  On my exam patient with a few faint expiratory wheezes moving air much better.  Given that patient has been having productive cough will get chest x-ray, and give Decadron and provide patient with albuterol inhaler.  Chest x-ray shows peribronchial thickening suggestive of asthma, no other active cardiopulmonary disease.  Patient reports she feels her breathing is back at baseline after steroids and nebulizer treatments, patient given albuterol inhaler here in the ED.  At this time she is stable for discharge home encouraged patient to follow-up early next week with her primary care doctor.  Strict return precautions discussed.  Patient expresses understanding and is in agreement with plan.  Final Clinical Impressions(s) / ED Diagnoses   Final diagnoses:  Exacerbation of asthma, unspecified asthma severity, unspecified whether persistent    ED Discharge Orders    None       Legrand RamsFord, Gitty Osterlund N, PA-C 02/20/18 0142    Abelino DerrickMackuen, Courteney Lyn, MD 02/20/18 2352

## 2018-02-19 NOTE — ED Triage Notes (Signed)
Presents with Asthma exacerbation that began a few days ago. It is worse at night and awssocited with cough and chest tightness. She ran out of her asthma inhaler. Bilateral expiratory wheezes noted. Sats 97% on RA

## 2018-05-24 ENCOUNTER — Encounter (HOSPITAL_BASED_OUTPATIENT_CLINIC_OR_DEPARTMENT_OTHER): Payer: Self-pay

## 2018-05-24 ENCOUNTER — Other Ambulatory Visit: Payer: Self-pay

## 2018-05-24 ENCOUNTER — Emergency Department (HOSPITAL_BASED_OUTPATIENT_CLINIC_OR_DEPARTMENT_OTHER)
Admission: EM | Admit: 2018-05-24 | Discharge: 2018-05-24 | Disposition: A | Discharge status: Home / Self Care | Payer: Medicaid Other | Attending: Emergency Medicine | Admitting: Emergency Medicine

## 2018-05-24 DIAGNOSIS — Z79899 Other long term (current) drug therapy: Secondary | ICD-10-CM | POA: Insufficient documentation

## 2018-05-24 DIAGNOSIS — N898 Other specified noninflammatory disorders of vagina: Secondary | ICD-10-CM | POA: Diagnosis present

## 2018-05-24 DIAGNOSIS — Z91010 Allergy to peanuts: Secondary | ICD-10-CM | POA: Diagnosis not present

## 2018-05-24 DIAGNOSIS — J45909 Unspecified asthma, uncomplicated: Secondary | ICD-10-CM | POA: Diagnosis not present

## 2018-05-24 DIAGNOSIS — N760 Acute vaginitis: Secondary | ICD-10-CM | POA: Insufficient documentation

## 2018-05-24 DIAGNOSIS — B9689 Other specified bacterial agents as the cause of diseases classified elsewhere: Secondary | ICD-10-CM

## 2018-05-24 LAB — URINALYSIS, ROUTINE W REFLEX MICROSCOPIC
Bilirubin Urine: NEGATIVE
Glucose, UA: NEGATIVE mg/dL
Hgb urine dipstick: NEGATIVE
Ketones, ur: NEGATIVE mg/dL
Leukocytes, UA: NEGATIVE
Nitrite: NEGATIVE
Protein, ur: NEGATIVE mg/dL
Specific Gravity, Urine: 1.015 (ref 1.005–1.030)
pH: 6.5 (ref 5.0–8.0)

## 2018-05-24 LAB — PREGNANCY, URINE: Preg Test, Ur: NEGATIVE

## 2018-05-24 LAB — WET PREP, GENITAL
Sperm: NONE SEEN
Trich, Wet Prep: NONE SEEN
Yeast Wet Prep HPF POC: NONE SEEN

## 2018-05-24 MED ORDER — CLOTRIMAZOLE 2 % VA CREA: 1.0000 | TOPICAL_CREAM | Freq: Every day | VAGINAL | 0 refills | Status: AC

## 2018-05-24 MED ORDER — METRONIDAZOLE 500 MG PO TABS: 500.0000 mg | ORAL_TABLET | Freq: Two times a day (BID) | ORAL | 0 refills | Status: DC

## 2018-05-24 NOTE — ED Provider Notes (Signed)
[facility] EMERGENCY DEPARTMENT Provider Note   CSN: [id number] Arrival date & time: 05/24/18  1405     History   Chief Complaint Chief Complaint  Patient presents with  . Vaginal Itching    HPI Cheryl Logan ([place]) Cheryl Logan is a 22 y.o. female with a hx of asthma, chlamydia, GERD, and genital HSV who presents to the ED with complaints of vaginal itching which started today. Patient states yesterday was fairly normal day, today woke up with the itchiness which has been constant and without specific alleviating/aggravting factors. No interventions prior to arrival. LMP 2 days ago, now having some residual mild spotting. Denies vaginal discharge, lesions, fever, chills, or dysuria. She is sexually active with one female partner, does not use protection.   HPI  Past Medical History:  Diagnosis Date  . Asthma   . Chlamydia   . Eczema   . GERD (gastroesophageal reflux disease)   . HSV (herpes simplex virus) infection    history of-no current lesions  . Pyelonephritis complicating pregnancy, antepartum 07/17/2014    Patient Active Problem List   Diagnosis Date Noted  . Indication for care or intervention in labor or delivery 11/02/2016  . Status post primary low transverse cesarean section 10/12/2014  . Normal labor 10/09/2014  . 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 Multiple food allergies--banana, fish oil, peanut 10/09/2014  . Asthma, chronic 10/09/2014  . Eczema 10/09/2014  . GBS (group B streptococcus) UTI complicating pregnancy--07/16/14 08/07/2014  . Short cervix 07/18/2014  . Pyelonephritis complicating pregnancy, antepartum 07/17/2014    Past Surgical History:  Procedure Laterality Date  . CESAREAN SECTION N/A 10/10/2014   Procedure: CESAREAN SECTION;  Surgeon: Purcell Nails, MD;  Location: [facility];  Service: Obstetrics;  Laterality: N/A;  . CESAREAN SECTION N/A 11/02/2016   Procedure: CESAREAN SECTION;  Surgeon: Jaymes Graff, MD;  Location: [facility];  Service:  Obstetrics;  Laterality: N/A;  . HERNIA REPAIR    . NO PAST SURGERIES       OB History    Gravida  2   Para  2   Term  2   Preterm      AB      Living  2     SAB      TAB      Ectopic      Multiple  0   Live Births  2            Home Medications    Prior to Admission medications   Medication Sig Start Date End Date Taking? Authorizing Provider  albuterol (PROAIR HFA) 108 (90 Base) MCG/ACT inhaler Inhale 2 puffs into the lungs every 6 (six) hours as needed for wheezing or shortness of breath. 09/05/16   Tegeler, Canary Brim, MD  amoxicillin (AMOXIL) 500 MG capsule Take 1 capsule (500 mg total) by mouth 3 (three) times daily. 04/22/17   Geoffery Lyons, MD  fluticasone (FLONASE) 50 MCG/ACT nasal spray Place 1 spray into both nostrils daily. 05/14/17   Audry Pili, PA-C  loratadine (CLARITIN) 10 MG tablet Take 1 tablet (10 mg total) by mouth daily. 03/13/17   Jeanie Sewer, PA-C    Family History No family history on file.  Social History Social History   Tobacco Use  . Smoking status: Never Smoker  . Smokeless tobacco: Never Used  Substance Use Topics  . Alcohol use: No  . Drug use: No     Allergies   Fish allergy; Shellfish allergy; Banana; and Peanut-containing  drug products   Review of Systems Review of Systems  Constitutional: Negative for chills and fever.  Gastrointestinal: Negative for abdominal pain and vomiting.  Genitourinary: Positive for vaginal bleeding (spotting). Negative for dysuria, urgency, vaginal discharge and vaginal pain.       Positive for vaginal itching.   All other systems reviewed and are negative.   Physical Exam Updated Vital Signs BP 108/64 (BP Location: Right Arm)   Pulse 81   Temp 98 F (36.7 C) (Oral)   Resp 18   Ht 5\' 2"  (1.575 m)   Wt 74.8 kg (165 lb)   LMP 05/17/2018   SpO2 98%   BMI 30.18 kg/m   Physical Exam  Constitutional: She appears well-developed and well-nourished. No distress.  HENT:    Head: Normocephalic and atraumatic.  Eyes: Conjunctivae are normal. Right eye exhibits no discharge. Left eye exhibits no discharge.  Abdominal: Soft. She exhibits no distension. There is no tenderness.  Genitourinary: Pelvic exam was performed with patient supine. No labial fusion. There is no rash, tenderness, lesion or injury on the right labia. There is no rash, tenderness, lesion or injury on the left labia. Cervix exhibits discharge (white, mild to moderate amount). Cervix exhibits no motion tenderness and no friability. Right adnexum displays no mass, no tenderness and no fullness. Left adnexum displays no mass, no tenderness and no fullness. No bleeding in the vagina. No foreign body in the vagina.  Genitourinary Comments: EDT present as chaperone.   Lymphadenopathy: No inguinal adenopathy noted on the right or left side.  Neurological: She is alert.  Clear speech.   Psychiatric: She has a normal mood and affect. Her behavior is normal. Thought content normal.  Nursing note and vitals reviewed.   ED Treatments / Results  Labs Results for orders placed or performed during the hospital encounter of 05/24/18  Wet prep, genital  Result Value Ref Range   Yeast Wet Prep HPF POC NONE SEEN NONE SEEN   Trich, Wet Prep NONE SEEN NONE SEEN   Clue Cells Wet Prep HPF POC PRESENT (A) NONE SEEN   WBC, Wet Prep HPF POC MANY (A) NONE SEEN   Sperm NONE SEEN   Urinalysis, Routine w reflex microscopic  Result Value Ref Range   Color, Urine YELLOW YELLOW   APPearance HAZY (A) CLEAR   Specific Gravity, Urine 1.015 1.005 - 1.030   pH 6.5 5.0 - 8.0   Glucose, UA NEGATIVE NEGATIVE mg/dL   Hgb urine dipstick NEGATIVE NEGATIVE   Bilirubin Urine NEGATIVE NEGATIVE   Ketones, ur NEGATIVE NEGATIVE mg/dL   Protein, ur NEGATIVE NEGATIVE mg/dL   Nitrite NEGATIVE NEGATIVE   Leukocytes, UA NEGATIVE NEGATIVE  Pregnancy, urine  Result Value Ref Range   Preg Test, Ur NEGATIVE NEGATIVE   No results  found. EKG None  Radiology No results found.  Procedures Procedures (including critical care time)  Medications Ordered in ED Medications - No data to display   Initial Impression / Assessment and Plan / ED Course  I have reviewed the triage vital signs and the nursing notes.  Pertinent labs & imaging results that were available during my care of the patient were reviewed by me and considered in my medical decision making (see chart for details).   Patient presents to the emergency department today with complaints of vaginal pruritus.  Mild white vaginal discharge on exam, otherwise benign.  No lesions or rashes to raise concern for etiology such as herpes, tinea, or scabies.  Urine  without signs of infection.  Pregnancy test negative.  Wet prep does have findings consistent with bacterial vaginosis, will treat with Flagyl, instructed patient not to consume alcohol when taking this medicine.  Wet prep negative for yeast, however given main complaint is itching will also give topical clotrimazole.   Patient has gonorrhea, chlamydia, syphilis, and HIV tests pending, offered prophylaxis for GC/chlamydia, patient declined, I feel this is reasonable.  She is aware that if any test is positive she will need to inform all sexual partners and seek treatment. I discussed results, treatment plan, need for PCP follow-up, and return precautions with the patient. Provided opportunity for questions, patient confirmed understanding and is in agreement with plan.    Final Clinical Impressions(s) / ED Diagnoses   Final diagnoses:  Bacterial vaginosis  Vaginal itching    ED Discharge Orders        Ordered    metroNIDAZOLE (FLAGYL) 500 MG tablet  2 times daily     05/24/18 1638    clotrimazole (GYNE-LOTRIMIN 3) 2 % vaginal cream  Daily at bedtime     05/24/18 [address] Pleas Koch, PA-C 05/24/18 1841    Maia Plan, MD 05/25/18 [phone]

## 2018-05-24 NOTE — Discharge Instructions (Addendum)
You are seen in the emergency department today for vaginal itching.  It appears that you have bacterial vaginosis, please see the attached handout, we are treating this with Flagyl, this is an antibiotic. Please take all of your antibiotics until finished. You may develop abdominal discomfort or diarrhea from the antibiotic.  You may help offset this with probiotics which you can buy at the store (ask your pharmacist if unable to find) or get probiotics in the form of eating yogurt. Do not eat or take the probiotics until 2 hours after your antibiotic. If you are unable to tolerate these side effects follow-up with your primary care provider or return to the emergency department.   If you begin to experience any blistering, rashes, swelling, or difficulty breathing seek medical care for evaluation of potentially more serious side effects.   Please be aware that this medication may interact with other medications you are taking, please be sure to discuss your medication list with your pharmacist. If you are taking birth control the antibiotic will deactivate your birth control for 2 weeks.  Do not drink alcohol when taking Flagyl as it can have serious side effects.  We are also sending you home with a prescription for clotrimazole cream, this is to help with itching, please use this vaginal insert for the next 3 nights.   Please follow-up with your primary care provider or your OB/GYN within 3 days for reevaluation, return to the ER anytime for new or worsening symptoms or any other concerns.

## 2018-05-24 NOTE — ED Triage Notes (Signed)
C/o vaginal itching x today-denies pain and d/c-NAD-steady gait

## 2018-05-25 LAB — HIV ANTIBODY (ROUTINE TESTING W REFLEX): HIV Screen 4th Generation wRfx: NONREACTIVE

## 2018-05-25 LAB — GC/CHLAMYDIA PROBE AMP (~~LOC~~) NOT AT ARMC
Chlamydia: POSITIVE — AB
Neisseria Gonorrhea: POSITIVE — AB

## 2018-05-25 LAB — RPR: RPR Ser Ql: NONREACTIVE

## 2018-05-28 ENCOUNTER — Ambulatory Visit (INDEPENDENT_AMBULATORY_CARE_PROVIDER_SITE_OTHER): Payer: Medicaid Other

## 2018-05-28 DIAGNOSIS — A549 Gonococcal infection, unspecified: Secondary | ICD-10-CM

## 2018-05-28 DIAGNOSIS — A749 Chlamydial infection, unspecified: Secondary | ICD-10-CM

## 2018-05-28 DIAGNOSIS — Z202 Contact with and (suspected) exposure to infections with a predominantly sexual mode of transmission: Secondary | ICD-10-CM

## 2018-05-28 MED ORDER — CEFTRIAXONE SODIUM 250 MG IJ SOLR
250.0000 mg | Freq: Once | INTRAMUSCULAR | Status: AC
  Administered 2018-05-28: 250 mg via INTRAMUSCULAR

## 2018-05-28 MED ORDER — AZITHROMYCIN 250 MG PO TABS
1000.0000 mg | ORAL_TABLET | Freq: Once | ORAL | Status: AC
  Administered 2018-05-28: 1000 mg via ORAL

## 2018-05-28 MED ORDER — AZITHROMYCIN 250 MG PO TABS: 1000.0000 mg | ORAL_TABLET | Freq: Once | ORAL | 0 refills | Status: DC

## 2018-05-28 NOTE — Progress Notes (Signed)
Chart reviewed for nurse visit. Agree with plan of care.   Malikah Lakey Lorraine, CNM 05/28/2018 5:12 PM   

## 2018-05-28 NOTE — Progress Notes (Signed)
Pt came today to get treated for Gonorrhea & Chlamydia.Gave orally Zithromax 1 gram PO 1 dose & Rocephin 250 mg mixed w/ 1 ml 1% lidocaine IM.Pt. tolerated well. Advised will come back for TOC in 4 weeks.Pt verbalized understanding.

## 2019-03-03 ENCOUNTER — Emergency Department (HOSPITAL_BASED_OUTPATIENT_CLINIC_OR_DEPARTMENT_OTHER): Payer: Medicaid Other

## 2019-03-03 ENCOUNTER — Emergency Department (HOSPITAL_BASED_OUTPATIENT_CLINIC_OR_DEPARTMENT_OTHER)
Admission: EM | Admit: 2019-03-03 | Discharge: 2019-03-03 | Disposition: A | Discharge status: Home / Self Care | Payer: Medicaid Other | Attending: Emergency Medicine | Admitting: Emergency Medicine

## 2019-03-03 ENCOUNTER — Encounter (HOSPITAL_BASED_OUTPATIENT_CLINIC_OR_DEPARTMENT_OTHER): Payer: Self-pay | Admitting: Emergency Medicine

## 2019-03-03 ENCOUNTER — Other Ambulatory Visit: Payer: Self-pay

## 2019-03-03 DIAGNOSIS — O2391 Unspecified genitourinary tract infection in pregnancy, first trimester: Secondary | ICD-10-CM | POA: Diagnosis not present

## 2019-03-03 DIAGNOSIS — O209 Hemorrhage in early pregnancy, unspecified: Secondary | ICD-10-CM | POA: Diagnosis present

## 2019-03-03 DIAGNOSIS — O99511 Diseases of the respiratory system complicating pregnancy, first trimester: Secondary | ICD-10-CM | POA: Diagnosis not present

## 2019-03-03 DIAGNOSIS — Z79899 Other long term (current) drug therapy: Secondary | ICD-10-CM | POA: Diagnosis not present

## 2019-03-03 DIAGNOSIS — O4690 Antepartum hemorrhage, unspecified, unspecified trimester: Secondary | ICD-10-CM

## 2019-03-03 DIAGNOSIS — J45909 Unspecified asthma, uncomplicated: Secondary | ICD-10-CM | POA: Diagnosis not present

## 2019-03-03 DIAGNOSIS — Z91010 Allergy to peanuts: Secondary | ICD-10-CM | POA: Insufficient documentation

## 2019-03-03 DIAGNOSIS — B9689 Other specified bacterial agents as the cause of diseases classified elsewhere: Secondary | ICD-10-CM

## 2019-03-03 DIAGNOSIS — N760 Acute vaginitis: Secondary | ICD-10-CM

## 2019-03-03 DIAGNOSIS — Z3A00 Weeks of gestation of pregnancy not specified: Secondary | ICD-10-CM | POA: Insufficient documentation

## 2019-03-03 LAB — CBC WITH DIFFERENTIAL/PLATELET
Abs Immature Granulocytes: 0 10*3/uL (ref 0.00–0.07)
Basophils Absolute: 0 10*3/uL (ref 0.0–0.1)
Basophils Relative: 1 %
Eosinophils Absolute: 0.6 10*3/uL — ABNORMAL HIGH (ref 0.0–0.5)
Eosinophils Relative: 11 %
HCT: 42.1 % (ref 36.0–46.0)
Hemoglobin: 13.4 g/dL (ref 12.0–15.0)
Immature Granulocytes: 0 %
Lymphocytes Relative: 26 %
Lymphs Abs: 1.5 10*3/uL (ref 0.7–4.0)
MCH: 26.8 pg (ref 26.0–34.0)
MCHC: 31.8 g/dL (ref 30.0–36.0)
MCV: 84.2 fL (ref 80.0–100.0)
Monocytes Absolute: 0.4 10*3/uL (ref 0.1–1.0)
Monocytes Relative: 7 %
Neutro Abs: 3.2 10*3/uL (ref 1.7–7.7)
Neutrophils Relative %: 55 %
Platelets: 302 10*3/uL (ref 150–400)
RBC: 5 MIL/uL (ref 3.87–5.11)
RDW: 13 % (ref 11.5–15.5)
WBC: 5.8 10*3/uL (ref 4.0–10.5)
nRBC: 0 % (ref 0.0–0.2)

## 2019-03-03 LAB — URINALYSIS, ROUTINE W REFLEX MICROSCOPIC
Bilirubin Urine: NEGATIVE
Glucose, UA: NEGATIVE mg/dL
Hgb urine dipstick: NEGATIVE
Ketones, ur: NEGATIVE mg/dL
Leukocytes,Ua: NEGATIVE
Nitrite: NEGATIVE
Protein, ur: NEGATIVE mg/dL
Specific Gravity, Urine: 1.025 (ref 1.005–1.030)
pH: 6.5 (ref 5.0–8.0)

## 2019-03-03 LAB — PREGNANCY, URINE: Preg Test, Ur: POSITIVE — AB

## 2019-03-03 LAB — WET PREP, GENITAL
Sperm: NONE SEEN
Trich, Wet Prep: NONE SEEN
Yeast Wet Prep HPF POC: NONE SEEN

## 2019-03-03 LAB — HCG, QUANTITATIVE, PREGNANCY: hCG, Beta Chain, Quant, S: 112 m[IU]/mL — ABNORMAL HIGH (ref <5)

## 2019-03-03 MED ORDER — METRONIDAZOLE 500 MG PO TABS: 500.0000 mg | ORAL_TABLET | Freq: Two times a day (BID) | ORAL | 0 refills | Status: AC

## 2019-03-03 NOTE — ED Notes (Signed)
Awaiting US.

## 2019-03-03 NOTE — ED Notes (Signed)
Patient is resting comfortably. 

## 2019-03-03 NOTE — ED Triage Notes (Signed)
Pt reports vaginal bleeding x 1 week with 2 positive pregnancy tests. Denies pain

## 2019-03-03 NOTE — ED Notes (Signed)
Returned to room from US.

## 2019-03-03 NOTE — ED Notes (Signed)
States has been having vag bleeding since Sat, denies pain

## 2019-03-03 NOTE — Discharge Instructions (Addendum)
You will need to follow up in 48 hours at either the [facility] or the [facility] now located at [facility].   Please return to the emergency department immediately for any fevers, severe abdominal pain, severe vaginal bleeding, or any new or worsening symptoms.

## 2019-03-03 NOTE — ED Provider Notes (Addendum)
[facility] EMERGENCY DEPARTMENT Provider Note   CSN: [id number] Arrival date & time: 03/03/19  1225    History   Chief Complaint Chief Complaint  Patient presents with   Vaginal Bleeding    HPI Cheryl Logan is a 23 y.o. female.     HPI   Pt is a 23 y/o female with a h/o asthma, chlamydia, eczema, GERD, HSV, pyelonephritis, G3P2A0, who presents to the ED today for evaluation of vaginal bleeding that began about 5 days ago. Initially bleeding was heavy with some clots, but it has slowed since onset. Currently she is having to change her pad/tampon ever 2-3 hours. No abd pain or cramping. No NV. No dysuria, frequency, urgency, or vaginal discharge. No fevers.  States LMP was 3/16.    States she has had 2 positive pregnancy tests at home.   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   Multiple food allergies--banana, fish oil, peanut 10/09/2014   Asthma, chronic 10/09/2014   Eczema 10/09/2014   GBS (group B streptococcus) UTI complicating pregnancy--07/16/14 08/07/2014   Short cervix 07/18/2014   Pyelonephritis complicating pregnancy, antepartum 07/17/2014    Past Surgical History:  Procedure Laterality Date   CESAREAN SECTION N/A 10/10/2014   Procedure: CESAREAN SECTION;  Surgeon: Purcell Nails, MD;  Location: [facility];  Service: Obstetrics;  Laterality: N/A;   CESAREAN SECTION N/A 11/02/2016   Procedure: CESAREAN SECTION;  Surgeon: Jaymes Graff, MD;  Location: [facility];   Service: Obstetrics;  Laterality: N/A;   HERNIA REPAIR     NO PAST SURGERIES       OB History    Gravida  2   Para  2   Term  2   Preterm      AB      Living  2     SAB      TAB      Ectopic      Multiple  0   Live Births  2            Home Medications    Prior to Admission medications   Medication Sig Start Date End Date Taking? Authorizing Provider  albuterol (PROAIR HFA) 108 (90 Base) MCG/ACT inhaler Inhale 2 puffs into the lungs every 6 (six) hours as needed for wheezing or shortness of breath. 09/05/16   Tegeler, Canary Brim, MD  fluticasone (FLONASE) 50 MCG/ACT nasal spray Place 1 spray into both nostrils daily. Patient not taking: Reported on 05/28/2018 05/14/17   Audry Pili, PA-C  loratadine (CLARITIN) 10 MG tablet Take 1 tablet (10 mg total) by mouth daily. 03/13/17   Fawze, Mina A, PA-C  metroNIDAZOLE (FLAGYL) 500 MG tablet Take 1 tablet (500 mg total) by mouth 2 (two) times daily for 7 days. 03/03/19 03/10/19  Yulian Gosney S, PA-C    Family History No family history on file.  Social History Social History   Tobacco Use   Smoking status: Never Smoker   Smokeless tobacco: Never Used  Substance Use  Topics   Alcohol use: No   Drug use: No     Allergies   Fish allergy; Shellfish allergy; Banana; and Peanut-containing drug products   Review of Systems Review of Systems  Constitutional: Negative for fever.  HENT: Negative for sore throat.   Eyes: Negative for visual disturbance.  Respiratory: Negative for cough and shortness of breath.   Cardiovascular: Negative for chest pain and palpitations.  Gastrointestinal: Negative for abdominal pain, constipation, diarrhea, nausea and vomiting.  Genitourinary: Positive for vaginal bleeding. Negative for dysuria, frequency, hematuria and vaginal discharge.  Musculoskeletal: Negative for back pain.  Skin: Negative for rash.  Neurological: Negative for headaches.  All other systems reviewed  and are negative.    Physical Exam Updated Vital Signs BP 104/70 (BP Location: Right Arm)    Pulse 97    Temp 98.3 F (36.8 C) (Oral)    Resp 14    Ht 5\' 2"  (1.575 m)    Wt 63.5 kg    LMP 01/17/2019    SpO2 100%    BMI 25.61 kg/m   Physical Exam Vitals signs and nursing note reviewed.  Constitutional:      General: She is not in acute distress.    Appearance: She is well-developed.  HENT:     Head: Normocephalic and atraumatic.  Eyes:     Conjunctiva/sclera: Conjunctivae normal.  Neck:     Musculoskeletal: Neck supple.  Cardiovascular:     Rate and Rhythm: Normal rate and regular rhythm.     Heart sounds: No murmur.  Pulmonary:     Effort: Pulmonary effort is normal. No respiratory distress.     Breath sounds: Normal breath sounds.  Abdominal:     General: Bowel sounds are normal. There is no distension.     Palpations: Abdomen is soft.     Tenderness: There is no guarding or rebound.     Comments: Minimal suprapubic ttp  Genitourinary:    Comments: Exam performed by Karrie Meres,  exam chaperoned Date: 03/03/2019 Pelvic exam: normal external genitalia without evidence of trauma. VULVA: normal appearing vulva with no masses, tenderness or lesion. VAGINA: normal appearing vagina with normal color and discharge, no lesions. There is a scant amount of blood in the vaginal vault.  CERVIX: normal appearing cervix without lesions, cervical motion tenderness absent, cervical os closed with out purulent discharge, however there is some scant bleeding from the cervix; vaginal discharge is not present, Wet prep and DNA probe for chlamydia and GC obtained.  ADNEXA: normal adnexa in size, nontender and no masses UTERUS: uterus is normal size, shape, consistency and nontender.  Skin:    General: Skin is warm and dry.  Neurological:     Mental Status: She is alert.     ED Treatments / Results  Labs (all labs ordered are listed, but only abnormal results are displayed) Labs  Reviewed  WET PREP, GENITAL - Abnormal; Notable for the following components:      Result Value   Clue Cells Wet Prep HPF POC PRESENT (*)    WBC, Wet Prep HPF POC MODERATE (*)    All other components within normal limits  PREGNANCY, URINE - Abnormal; Notable for the following components:   Preg Test, Ur POSITIVE (*)    All other components within normal limits  CBC WITH DIFFERENTIAL/PLATELET - Abnormal; Notable for the following components:   Eosinophils Absolute 0.6 (*)    All other components within normal limits  HCG, QUANTITATIVE, PREGNANCY - Abnormal;  Notable for the following components:   hCG, Beta Chain, Quant, S 112 (*)    All other components within normal limits  WET PREP, GENITAL  URINALYSIS, ROUTINE W REFLEX MICROSCOPIC  GC/CHLAMYDIA PROBE AMP ([place]) NOT AT [facility]    EKG None  Radiology [place] Ob Comp < 14 Wks  Result Date: 03/03/2019 CLINICAL DATA:  Pregnant patient with bleeding. EXAM: OBSTETRIC <14 WK US AND TRANSVAGINAL OB US TECHNIQUE: Both transabdominal and transvaginal ultrasound examinations were performed for complete evaluation of the gestation as well as the maternal uterus, adnexal regions, and pelvic cul-de-sac. Transvaginal technique was performed to assess early pregnancy. COMPARISON:  None. FINDINGS: Intrauterine gestational sac: None Maternal uterus/adnexae: The ovaries are normal in appearance with a probable corpus luteum cyst in the left ovary. There may be a small amount of fluid in the endometrial canal which could represent blood products given history. Trace physiologic fluid in the pelvis. IMPRESSION: 1. No IUP identified. In the setting of a positive pregnancy test, the lack of an IUP could represent ectopic pregnancy, recent miscarriage, or early pregnancy. Recommend clinical correlation and close follow-up with imaging and labs. 2. No other significant abnormalities. Electronically Signed   By: Gerome Samavid  Williams III M.D   On: 03/03/2019 14:29   [place]  Ob Transvaginal  Result Date: 03/03/2019 CLINICAL DATA:  Pregnant patient with bleeding. EXAM: OBSTETRIC <14 WK US AND TRANSVAGINAL OB US TECHNIQUE: Both transabdominal and transvaginal ultrasound examinations were performed for complete evaluation of the gestation as well as the maternal uterus, adnexal regions, and pelvic cul-de-sac. Transvaginal technique was performed to assess early pregnancy. COMPARISON:  None. FINDINGS: Intrauterine gestational sac: None Maternal uterus/adnexae: The ovaries are normal in appearance with a probable corpus luteum cyst in the left ovary. There may be a small amount of fluid in the endometrial canal which could represent blood products given history. Trace physiologic fluid in the pelvis. IMPRESSION: 1. No IUP identified. In the setting of a positive pregnancy test, the lack of an IUP could represent ectopic pregnancy, recent miscarriage, or early pregnancy. Recommend clinical correlation and close follow-up with imaging and labs. 2. No other significant abnormalities. Electronically Signed   By: Gerome Samavid  Williams III M.D   On: 03/03/2019 14:29    Procedures Procedures (including critical care time)  Medications Ordered in ED Medications - No data to display   Initial Impression / Assessment and Plan / ED Course  I have reviewed the triage vital signs and the nursing notes.  Pertinent labs & imaging results that were available during my care of the patient were reviewed by me and considered in my medical decision making (see chart for details).     Final Clinical Impressions(s) / ED Diagnoses   Final diagnoses:  Vaginal bleeding in pregnancy  BV (bacterial vaginosis)   Pt presenting to the ED to c/o vaginal bleeding. Had 2 positive pregnancy tests at home. No abd pain or fevers.  Well appearing on exam. NAD. Pelvic exam with blood in the vaginal vault. Cervical os is closed. No cmt or adnexal ttp.   CBC ordered due to ongoing bleeding, which showed no  evidence of anemia.  u preg positive UA without evidence of UTI Beta hcg is slightly positive just above 100 Wet prep with WBC and clue cells, pt wishes to proceed with tx of BV will give flagyl. GC/chlamydia obtained.   Reviewed records, pt Rh +. Will not require rhogam.   Given her bleeding and present of mild suprapubic  tenderness, pelvic US was ordered to r/o possible ectopic. US showed no evidence of IUP which would be expected with low hcg, however there was also no evidence of ectopic. I advised pt to follow-up in 48 hours for repeat beta-hCG.  Give information for [facility], also give information from his outpatient clinic as she has yet to establish with an OB/GYN.  Have advised on strict return precautions for new or worsening symptoms.  She voiced understanding of the plan and reasons to return to the ED.  All questions answered.  Patient stable for discharge.  ED Discharge Orders         Ordered    metroNIDAZOLE (FLAGYL) 500 MG tablet  2 times daily     03/03/19 [address], Eather Colas, PA-C 03/03/19 1510    Karrie Meres, PA-C 03/03/19 1512    Geoffery Lyons, MD 03/04/19 [phone]

## 2019-03-04 ENCOUNTER — Telehealth (HOSPITAL_COMMUNITY): Payer: Self-pay

## 2019-03-04 LAB — WET PREP, GENITAL

## 2019-03-04 LAB — GC/CHLAMYDIA PROBE AMP (~~LOC~~) NOT AT ARMC
Chlamydia: NEGATIVE
Neisseria Gonorrhea: NEGATIVE

## 2019-03-10 ENCOUNTER — Other Ambulatory Visit: Payer: Self-pay

## 2019-03-10 ENCOUNTER — Encounter (HOSPITAL_BASED_OUTPATIENT_CLINIC_OR_DEPARTMENT_OTHER): Payer: Self-pay | Admitting: Emergency Medicine

## 2019-03-10 ENCOUNTER — Emergency Department (HOSPITAL_BASED_OUTPATIENT_CLINIC_OR_DEPARTMENT_OTHER)
Admission: EM | Admit: 2019-03-10 | Discharge: 2019-03-10 | Disposition: A | Discharge status: Home / Self Care | Payer: Medicaid Other | Attending: Emergency Medicine | Admitting: Emergency Medicine

## 2019-03-10 DIAGNOSIS — Z91010 Allergy to peanuts: Secondary | ICD-10-CM | POA: Insufficient documentation

## 2019-03-10 DIAGNOSIS — J45909 Unspecified asthma, uncomplicated: Secondary | ICD-10-CM | POA: Insufficient documentation

## 2019-03-10 DIAGNOSIS — E349 Endocrine disorder, unspecified: Secondary | ICD-10-CM | POA: Diagnosis not present

## 2019-03-10 DIAGNOSIS — Z79899 Other long term (current) drug therapy: Secondary | ICD-10-CM | POA: Insufficient documentation

## 2019-03-10 DIAGNOSIS — Z712 Person consulting for explanation of examination or test findings: Secondary | ICD-10-CM | POA: Diagnosis present

## 2019-03-10 LAB — HCG, QUANTITATIVE, PREGNANCY: hCG, Beta Chain, Quant, S: 130 m[IU]/mL — ABNORMAL HIGH (ref <5)

## 2019-03-10 NOTE — ED Provider Notes (Signed)
[facility] EMERGENCY DEPARTMENT Provider Note   CSN: [id number] Arrival date & time: 03/10/19  1012    History   Chief Complaint Chief Complaint  Patient presents with  . Follow-up    HPI Cheryl Logan ([place]) Cheryl Logan is a 23 y.o. female.      23 y.o G2P2 female with a PMH of Asthma, HSV, Chlamydia presents to the ED with a request for a follow up.  Patient was seen on April 30 for vaginal bleeding along with an hCG of 112, was advised to follow-up with women's clinic within 48 hours, patient reports she was under the impression she needed to return to the ED.  Patient reports since her last visit the vaginal bleeding has stopped 3 days ago, she did have some spotting yesterday morning but only used a liner the whole day.  She reports her last menstrual period was March 16.  Patient has attempted to contact multiple OB/GYN's but none are accepting new clients at this time per patient.  She denies any abdominal pain, fever, or vaginal discharge.     Past Medical History:  Diagnosis Date  . Asthma   . Chlamydia   . Eczema   . GERD (gastroesophageal reflux disease)   . HSV (herpes simplex virus) infection    history of-no current lesions  . Pyelonephritis complicating pregnancy, antepartum 07/17/2014    Patient Active Problem List   Diagnosis Date Noted  . Indication for care or intervention in labor or delivery 11/02/2016  . Status post primary low transverse cesarean section 10/12/2014  . Normal labor 10/09/2014  . 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 Multiple food allergies--banana, fish oil, peanut 10/09/2014  . Asthma, chronic 10/09/2014  . Eczema 10/09/2014  . GBS (group B streptococcus) UTI complicating pregnancy--07/16/14 08/07/2014  . Short cervix 07/18/2014  . Pyelonephritis complicating pregnancy, antepartum 07/17/2014    Past Surgical History:  Procedure Laterality Date  . CESAREAN SECTION N/A 10/10/2014   Procedure: CESAREAN SECTION;  Surgeon: Purcell Nails, MD;   Location: [facility];  Service: Obstetrics;  Laterality: N/A;  . CESAREAN SECTION N/A 11/02/2016   Procedure: CESAREAN SECTION;  Surgeon: Jaymes Graff, MD;  Location: [facility];  Service: Obstetrics;  Laterality: N/A;  . HERNIA REPAIR    . NO PAST SURGERIES       OB History    Gravida  3   Para  2   Term  2   Preterm      AB      Living  2     SAB      TAB      Ectopic      Multiple  0   Live Births  2            Home Medications    Prior to Admission medications   Medication Sig Start Date End Date Taking? Authorizing Provider  albuterol (PROAIR HFA) 108 (90 Base) MCG/ACT inhaler Inhale 2 puffs into the lungs every 6 (six) hours as needed for wheezing or shortness of breath. 09/05/16   Tegeler, Canary Brim, MD  fluticasone (FLONASE) 50 MCG/ACT nasal spray Place 1 spray into both nostrils daily. Patient not taking: Reported on 05/28/2018 05/14/17   Audry Pili, PA-C  loratadine (CLARITIN) 10 MG tablet Take 1 tablet (10 mg total) by mouth daily. 03/13/17   Fawze, Mina A, PA-C  metroNIDAZOLE (FLAGYL) 500 MG tablet Take 1 tablet (500 mg total) by mouth 2 (two) times daily for 7 days. 03/03/19 03/10/19  Couture, Cortni S, PA-C    Family History History reviewed. No pertinent family history.  Social History Social History   Tobacco Use  . Smoking status: Never Smoker  . Smokeless tobacco: Never Used  Substance Use Topics  . Alcohol use: No  . Drug use: No     Allergies   Fish allergy; Shellfish allergy; Banana; and Peanut-containing drug products   Review of Systems Review of Systems  Constitutional: Negative for fever.  Gastrointestinal: Negative for abdominal pain.  Genitourinary: Negative for flank pain, hematuria, vaginal bleeding, vaginal discharge and vaginal pain.     Physical Exam Updated Vital Signs BP (!) 112/58 (BP Location: Right Arm)   Pulse 86   Temp 98.2 F (36.8 C) (Oral)   Resp 18   LMP 01/17/2019   SpO2 100%   Physical  Exam Vitals signs and nursing note reviewed.  Constitutional:      General: She is not in acute distress.    Appearance: She is well-developed.     Comments: Non ill-appearing.   HENT:     Head: Normocephalic and atraumatic.     Mouth/Throat:     Pharynx: No oropharyngeal exudate.  Eyes:     Pupils: Pupils are equal, round, and reactive to light.  Neck:     Musculoskeletal: Normal range of motion.  Cardiovascular:     Rate and Rhythm: Regular rhythm.     Heart sounds: Normal heart sounds.  Pulmonary:     Effort: Pulmonary effort is normal. No respiratory distress.     Breath sounds: Normal breath sounds.     Comments: Lungs are clear to auscultation.  Abdominal:     General: Bowel sounds are normal. There is no distension.     Palpations: Abdomen is soft.     Tenderness: There is no abdominal tenderness.     Comments: No abdominal tenderness with palpation, no guarding or rebound.    Musculoskeletal:        General: No tenderness or deformity.     Right lower leg: No edema.     Left lower leg: No edema.  Skin:    General: Skin is warm and dry.  Neurological:     Mental Status: She is alert and oriented to person, place, and time.      ED Treatments / Results  Labs (all labs ordered are listed, but only abnormal results are displayed) Labs Reviewed  HCG, QUANTITATIVE, PREGNANCY    EKG None  Radiology No results found.  Procedures Procedures (including critical care time)  Medications Ordered in ED Medications - No data to display   Initial Impression / Assessment and Plan / ED Course  I have reviewed the triage vital signs and the nursing notes.  Pertinent labs & imaging results that were available during my care of the patient were reviewed by me and considered in my medical decision making (see chart for details).   Patient with a previous HCG level of 112 during her visit on 03/03/2019 presents to the ED for a repeat on HCG levels. Patient had [place]  during last visit which did not show an IUP, she was recommended close follow up with Women's clinic within 48 hours but reports [facility] to home. She was treated for BV during her last visit with metronidazole, reports completion in therapy.   Patient is well appearing, no vaginal bleeding today, no abdominal pain, no fever or vaginal discharge. Will repeat beta HCG at this time.  12:02 PM  HCG level today was 130, patient was informed of her results, [facility] to [facility] discharge paperwork. No further workup warranted as patient denies any abdominal pain, vaginal bleeding, and had an negative US during her last visit. Patient asymptomatic with stable vital signs, return precautions discussed.   Portions of this note were generated with Scientist, clinical (histocompatibility and immunogenetics)Dragon dictation software. Dictation errors may occur despite best attempts at proofreading.    Final Clinical Impressions(s) / ED Diagnoses   Final diagnoses:  Elevated serum hCG    ED Discharge Orders    None       Claude MangesSoto, Fatma Rutten, PA-C 03/10/19 1207    Little, Ambrose Finlandachel Morgan, MD 03/10/19 1410

## 2019-03-10 NOTE — Discharge Instructions (Addendum)
Your HCG level today was 130. You need to follow up with [facility] for further management.

## 2019-03-10 NOTE — ED Triage Notes (Signed)
Pt here for lab work follow up.

## 2019-10-01 ENCOUNTER — Encounter (HOSPITAL_BASED_OUTPATIENT_CLINIC_OR_DEPARTMENT_OTHER): Payer: Self-pay | Admitting: *Deleted

## 2019-10-01 ENCOUNTER — Other Ambulatory Visit: Payer: Self-pay

## 2019-10-01 ENCOUNTER — Emergency Department (HOSPITAL_BASED_OUTPATIENT_CLINIC_OR_DEPARTMENT_OTHER): Payer: Medicaid Other

## 2019-10-01 ENCOUNTER — Emergency Department (HOSPITAL_BASED_OUTPATIENT_CLINIC_OR_DEPARTMENT_OTHER)
Admission: EM | Admit: 2019-10-01 | Discharge: 2019-10-01 | Disposition: A | Discharge status: Home / Self Care | Payer: Medicaid Other | Attending: Emergency Medicine | Admitting: Emergency Medicine

## 2019-10-01 DIAGNOSIS — Z91010 Allergy to peanuts: Secondary | ICD-10-CM | POA: Insufficient documentation

## 2019-10-01 DIAGNOSIS — Z20828 Contact with and (suspected) exposure to other viral communicable diseases: Secondary | ICD-10-CM | POA: Diagnosis not present

## 2019-10-01 DIAGNOSIS — Z91013 Allergy to seafood: Secondary | ICD-10-CM | POA: Insufficient documentation

## 2019-10-01 DIAGNOSIS — R0602 Shortness of breath: Secondary | ICD-10-CM | POA: Diagnosis present

## 2019-10-01 DIAGNOSIS — J45909 Unspecified asthma, uncomplicated: Secondary | ICD-10-CM | POA: Diagnosis not present

## 2019-10-01 DIAGNOSIS — Z79899 Other long term (current) drug therapy: Secondary | ICD-10-CM | POA: Diagnosis not present

## 2019-10-01 DIAGNOSIS — Z91018 Allergy to other foods: Secondary | ICD-10-CM | POA: Diagnosis not present

## 2019-10-01 LAB — SARS CORONAVIRUS 2 AG (30 MIN TAT): SARS Coronavirus 2 Ag: NEGATIVE

## 2019-10-01 MED ORDER — FLUTICASONE PROPIONATE 50 MCG/ACT NA SUSP: 1.0000 | Freq: Every day | NASAL | 0 refills | Status: DC

## 2019-10-01 MED ORDER — PREDNISONE 50 MG PO TABS: 50.0000 mg | ORAL_TABLET | Freq: Every day | ORAL | 0 refills | Status: DC

## 2019-10-01 NOTE — ED Provider Notes (Signed)
Cheryl Logan Provider Note   CSN: [id number] Arrival date & time: 10/01/19  1413     History   Chief Complaint Chief Complaint  Patient presents with  . Shortness of Breath    HPI Cheryl Logan is a 23 y.o. female with history of asthma who presents with 3-day history of shortness of breath, chest tightness.  She has had to use her inhaler more often.  She has had some associated wheezing.  She denies any fever or cough.  She does report nasal congestion and some frontal head pressure.  She has been around her mother all week who just tested positive for COVID-19.  She denies any abdominal pain, nausea, vomiting, diarrhea, loss of taste or smell.     HPI  Past Medical History:  Diagnosis Date  . Asthma   . Chlamydia   . Eczema   . GERD (gastroesophageal reflux disease)   . HSV (herpes simplex virus) infection    history of-no current lesions  . Pyelonephritis complicating pregnancy, antepartum 07/17/2014    Patient Active Problem List   Diagnosis Date Noted  . Indication for care or intervention in labor or delivery 11/02/2016  . Status post primary low transverse cesarean section 10/12/2014  . Normal labor 10/09/2014  . 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 Multiple food allergies--banana, fish oil, peanut 10/09/2014  . Asthma, chronic 10/09/2014  . Eczema 10/09/2014  . GBS (group B streptococcus) UTI complicating DJMEQASTM--1/96/22 08/07/2014  . Short cervix 07/18/2014  . Pyelonephritis complicating pregnancy, antepartum 07/17/2014    Past Surgical History:  Procedure Laterality Date  . CESAREAN SECTION N/A 10/10/2014   Procedure: CESAREAN SECTION;  Surgeon: Delice Lesch, MD;  Location: [facility];  Service: Obstetrics;  Laterality: N/A;  . CESAREAN SECTION N/A 11/02/2016   Procedure: CESAREAN SECTION;  Surgeon: Crawford Givens, MD;  Location: [place];  Service: Obstetrics;  Laterality: N/A;  . HERNIA REPAIR    . NO PAST SURGERIES        OB History    Gravida  3   Para  2   Term  2   Preterm      AB      Living  2     SAB      TAB      Ectopic      Multiple  0   Live Births  2            Home Medications    Prior to Admission medications   Medication Sig Start Date End Date Taking? Authorizing Provider  albuterol (PROAIR HFA) 108 (90 Base) MCG/ACT inhaler Inhale 2 puffs into the lungs every 6 (six) hours as needed for wheezing or shortness of breath. 09/05/16  Yes Tegeler, Gwenyth Allegra, MD  loratadine (CLARITIN) 10 MG tablet Take 1 tablet (10 mg total) by mouth daily. 03/13/17  Yes Fawze, Mina A, PA-C  fluticasone (FLONASE) 50 MCG/ACT nasal spray Place 1 spray into both nostrils daily. 10/01/19   Shernita Rabinovich, Bea Graff, PA-C  predniSONE (DELTASONE) 50 MG tablet Take 1 tablet (50 mg total) by mouth daily. 10/01/19   Frederica Kuster, PA-C    Family History No family history on file.  Social History Social History   Tobacco Use  . Smoking status: Never Smoker  . Smokeless tobacco: Never Used  Substance Use Topics  . Alcohol use: Yes  . Drug use: No     Allergies   Fish allergy, Shellfish allergy, Banana, and  Peanut-containing drug products   Review of Systems Review of Systems  Constitutional: Negative for chills and fever.  HENT: Positive for congestion. Negative for ear pain and sore throat.   Respiratory: Positive for chest tightness, shortness of breath and wheezing. Negative for cough.   Gastrointestinal: Negative for abdominal pain, diarrhea, nausea and vomiting.     Physical Exam Updated Vital Signs BP 105/68 (BP Location: Left Arm)   Pulse 93   Temp 98.1 F (36.7 C) (Oral)   Resp 16   Ht 5\' 2"  (1.575 m)   Wt 72.6 kg   LMP 09/04/2019 (Approximate)   SpO2 98%   Breastfeeding No   BMI 29.26 kg/m   Physical Exam Vitals signs and nursing note reviewed.  Constitutional:      General: She is not in acute distress.    Appearance: She is well-developed. She is not  diaphoretic.  HENT:     Head: Normocephalic and atraumatic.     Right Ear: Tympanic membrane normal.     Left Ear: There is impacted cerumen.     Nose:     Right Sinus: Frontal sinus tenderness present.     Left Sinus: Frontal sinus tenderness present.     Mouth/Throat:     Pharynx: No oropharyngeal exudate.  Eyes:     General: No scleral icterus.       Right eye: No discharge.        Left eye: No discharge.     Conjunctiva/sclera: Conjunctivae normal.     Pupils: Pupils are equal, round, and reactive to light.  Neck:     Musculoskeletal: Normal range of motion and neck supple.     Thyroid: No thyromegaly.  Cardiovascular:     Rate and Rhythm: Normal rate and regular rhythm.     Heart sounds: Normal heart sounds. No murmur. No friction rub. No gallop.   Pulmonary:     Effort: Pulmonary effort is normal. No respiratory distress.     Breath sounds: Normal breath sounds. No stridor. No wheezing or rales.  Abdominal:     General: Bowel sounds are normal. There is no distension.     Palpations: Abdomen is soft.     Tenderness: There is no abdominal tenderness. There is no guarding or rebound.  Lymphadenopathy:     Cervical: No cervical adenopathy.  Skin:    General: Skin is warm and dry.     Coloration: Skin is not pale.     Findings: No rash.  Neurological:     Mental Status: She is alert.     Coordination: Coordination normal.      ED Treatments / Results  Labs (all labs ordered are listed, but only abnormal results are displayed) Labs Reviewed  SARS CORONAVIRUS 2 AG (30 MIN TAT)  NOVEL CORONAVIRUS, NAA (HOSP ORDER, SEND-OUT TO REF LAB; TAT 18-24 HRS)    EKG None  Radiology Dg Chest Portable 1 View  Result Date: 10/01/2019 CLINICAL DATA:  Shortness of breath.  COVID-19 positive EXAM: PORTABLE CHEST 1 VIEW COMPARISON:  February 19, 2018 FINDINGS: The lungs are clear. Heart size and pulmonary vascularity are normal. No adenopathy. No bone lesions. IMPRESSION: No edema  or consolidation.  No evident adenopathy. Electronically Signed   By: February 21, 2018 III M.D.   On: 10/01/2019 15:22    Procedures Procedures (including critical care time)  Medications Ordered in ED Medications - No data to display   Initial Impression / Assessment and Plan / ED Course  I have reviewed the triage vital signs and the nursing notes.  Pertinent labs & imaging results that were available during my care of the patient were reviewed by me and considered in my medical decision making (see chart for details).        Patient presenting with shortness of breath.  She feels like she is having an asthma exacerbation.  Chest x-ray is clear.  Initial rapid Covid is negative.  Send out pending.  Will discharge home with 5-day burst of prednisone.  Continue using albuterol inhaler as needed.  Discharged home with Flonase for nasal congestion and sinus pain.  Suspect viral etiology.  Return precautions discussed.  Patient understands and agrees with plan.  Patient ambulated at 98-100% on room air without difficulty.  Patient vitals stable throughout ED course and discharged in satisfactory condition.  Final Clinical Impressions(s) / ED Diagnoses   Final diagnoses:  Shortness of breath    ED Discharge Orders         Ordered    predniSONE (DELTASONE) 50 MG tablet  Daily     10/01/19 1551    fluticasone (FLONASE) 50 MCG/ACT nasal spray  Daily     10/01/19 [address]1551           Mansel Strother M, PA-C 10/01/19 1553    Jacalyn LefevreHaviland, Julie, MD 10/02/19 [phone]

## 2019-10-01 NOTE — ED Notes (Signed)
Pt ambulated with Sp02 99-100 and HR 95-98

## 2019-10-01 NOTE — ED Triage Notes (Signed)
Pt reports having to use her inhaler more frequently than usual and has run out. States she has facial pain across her forehead. She also found out today her mother has tested +for covid

## 2019-10-01 NOTE — Discharge Instructions (Signed)
Take prednisone as prescribed for your asthma exacerbation.  Use Flonase once daily for your nasal congestion and sinus pain.  You will be called only if your second Covid test returns positive.  Please stay isolated, as it is still possible that you could be Covid positive.  Please return to the emergency department if you develop any new or worsening symptoms including worsening shortness of breath, passing out, fingers or lips turning blue, or any other concerning symptoms.

## 2019-10-03 LAB — NOVEL CORONAVIRUS, NAA (HOSP ORDER, SEND-OUT TO REF LAB; TAT 18-24 HRS): SARS-CoV-2, NAA: NOT DETECTED

## 2020-01-17 ENCOUNTER — Other Ambulatory Visit: Payer: Self-pay

## 2020-01-17 ENCOUNTER — Encounter (HOSPITAL_BASED_OUTPATIENT_CLINIC_OR_DEPARTMENT_OTHER): Payer: Self-pay

## 2020-01-17 ENCOUNTER — Emergency Department (HOSPITAL_BASED_OUTPATIENT_CLINIC_OR_DEPARTMENT_OTHER)
Admission: EM | Admit: 2020-01-17 | Discharge: 2020-01-17 | Disposition: A | Discharge status: Home / Self Care | Payer: Medicaid Other | Attending: Emergency Medicine | Admitting: Emergency Medicine

## 2020-01-17 DIAGNOSIS — B3731 Acute candidiasis of vulva and vagina: Secondary | ICD-10-CM

## 2020-01-17 DIAGNOSIS — B373 Candidiasis of vulva and vagina: Secondary | ICD-10-CM | POA: Insufficient documentation

## 2020-01-17 DIAGNOSIS — O23599 Infection of other part of genital tract in pregnancy, unspecified trimester: Secondary | ICD-10-CM | POA: Diagnosis not present

## 2020-01-17 DIAGNOSIS — Z79899 Other long term (current) drug therapy: Secondary | ICD-10-CM | POA: Insufficient documentation

## 2020-01-17 DIAGNOSIS — Z3A01 Less than 8 weeks gestation of pregnancy: Secondary | ICD-10-CM | POA: Diagnosis not present

## 2020-01-17 DIAGNOSIS — N898 Other specified noninflammatory disorders of vagina: Secondary | ICD-10-CM | POA: Diagnosis present

## 2020-01-17 HISTORY — DX: Unspecified staphylococcus as the cause of diseases classified elsewhere: B95.8

## 2020-01-17 LAB — URINALYSIS, ROUTINE W REFLEX MICROSCOPIC
Bilirubin Urine: NEGATIVE
Glucose, UA: NEGATIVE mg/dL
Hgb urine dipstick: NEGATIVE
Ketones, ur: NEGATIVE mg/dL
Nitrite: NEGATIVE
Protein, ur: NEGATIVE mg/dL
Specific Gravity, Urine: >1.03 — ABNORMAL HIGH (ref 1.005–1.030)
pH: 6 (ref 5.0–8.0)

## 2020-01-17 LAB — WET PREP, GENITAL
Sperm: NONE SEEN
Trich, Wet Prep: NONE SEEN

## 2020-01-17 LAB — PREGNANCY, URINE: Preg Test, Ur: POSITIVE — AB

## 2020-01-17 LAB — URINALYSIS, MICROSCOPIC (REFLEX)

## 2020-01-17 LAB — HIV ANTIBODY (ROUTINE TESTING W REFLEX): HIV Screen 4th Generation wRfx: NONREACTIVE

## 2020-01-17 LAB — HCG, QUANTITATIVE, PREGNANCY: hCG, Beta Chain, Quant, S: 90314 m[IU]/mL — ABNORMAL HIGH (ref <5)

## 2020-01-17 MED ORDER — CLOTRIMAZOLE 1 % VA CREA: 1.0000 | TOPICAL_CREAM | Freq: Every day | VAGINAL | 0 refills | Status: AC

## 2020-01-17 NOTE — ED Triage Notes (Addendum)
Pt c/o vaginal d/c and itching-started 2 days after abx for sinus infection-NAD-steady gait-pt added she had pos home preg test with LMP in Jan

## 2020-01-17 NOTE — ED Provider Notes (Signed)
[facility] EMERGENCY DEPARTMENT Provider Note   CSN: [id number] Arrival date & time: 01/17/20  1915     History Chief Complaint  Patient presents with  . Vaginal Discharge    Cheryl Logan is a 24 y.o. female who presents to the ED today with complaint of vaginal discharge and itching for the past 1.5 weeks. She reports being placed on Clindamycin for cellulitis/abscess of left buttock on 2/28. She states a couple of days later she began having vaginal discharge.  Patient reports the vaginal discharge has seemed to resolve however she continues to have vaginal itching.  Also mentions that she did not have a period in February.  She states her last normal menstrual period was sometime in January however she is unsure exactly the date.  She took a at home pregnancy test approximately 2 to 3 weeks ago which was positive.  Patient denies fevers, chills, abdominal pain, nausea, vomiting, diarrhea, urinary symptoms, pelvic pain, any other associated symptoms.   The history is provided by the patient.       Past Medical History:  Diagnosis Date  . Asthma   . Chlamydia   . Eczema   . GERD (gastroesophageal reflux disease)   . HSV (herpes simplex virus) infection    history of-no current lesions  . Pyelonephritis complicating pregnancy, antepartum 07/17/2014  . Staph infection     Patient Active Problem List   Diagnosis Date Noted  . Indication for care or intervention in labor or delivery 11/02/2016  . Status post primary low transverse cesarean section 10/12/2014  . Normal labor 10/09/2014  . 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 Multiple food allergies--banana, fish oil, peanut 10/09/2014  . Asthma, chronic 10/09/2014  . Eczema 10/09/2014  . GBS (group B streptococcus) UTI complicating DTOIZTIWP--06/11/97 08/07/2014  . Short cervix 07/18/2014  . Pyelonephritis complicating pregnancy, antepartum 07/17/2014    Past Surgical History:  Procedure Laterality Date  . CESAREAN  SECTION N/A 10/10/2014   Procedure: CESAREAN SECTION;  Surgeon: Delice Lesch, MD;  Location: [facility];  Service: Obstetrics;  Laterality: N/A;  . CESAREAN SECTION N/A 11/02/2016   Procedure: CESAREAN SECTION;  Surgeon: Crawford Givens, MD;  Location: [facility];  Service: Obstetrics;  Laterality: N/A;  . HERNIA REPAIR    . NO PAST SURGERIES       OB History    Gravida  4   Para  2   Term  2   Preterm      AB      Living  2     SAB      TAB      Ectopic      Multiple  0   Live Births  2           No family history on file.  Social History   Tobacco Use  . Smoking status: Never Smoker  . Smokeless tobacco: Never Used  Substance Use Topics  . Alcohol use: Yes    Comment: occ  . Drug use: No    Home Medications Prior to Admission medications   Medication Sig Start Date End Date Taking? Authorizing Provider  albuterol (PROAIR HFA) 108 (90 Base) MCG/ACT inhaler Inhale 2 puffs into the lungs every 6 (six) hours as needed for wheezing or shortness of breath. 09/05/16   Tegeler, Gwenyth Allegra, MD  clotrimazole (GYNE-LOTRIMIN) 1 % vaginal cream Place 1 Applicatorful vaginally at bedtime for 7 days. 01/17/20 01/24/20  Alroy Bailiff, Lilas Diefendorf, PA-C  fluticasone (FLONASE) 50  MCG/ACT nasal spray Place 1 spray into both nostrils daily. 10/01/19   Law, Waylan Boga, PA-C  loratadine (CLARITIN) 10 MG tablet Take 1 tablet (10 mg total) by mouth daily. 03/13/17   Fawze, Mina A, PA-C  predniSONE (DELTASONE) 50 MG tablet Take 1 tablet (50 mg total) by mouth daily. 10/01/19   Emi Holes, PA-C    Allergies    Fish allergy, Shellfish allergy, Banana, and Peanut-containing drug products  Review of Systems   Review of Systems  Constitutional: Negative for chills and fever.  Gastrointestinal: Negative for abdominal pain, diarrhea, nausea and vomiting.  Genitourinary: Positive for menstrual problem and vaginal discharge. Negative for dysuria and pelvic pain.  All other systems  reviewed and are negative.   Physical Exam Updated Vital Signs BP 124/71 (BP Location: Left Arm)   Pulse 98   Temp 97.6 F (36.4 C) (Oral)   Resp 18   Ht 5\' 1"  (1.549 m)   Wt 72.6 kg   LMP 09/04/2019 (Approximate)   SpO2 100%   BMI 30.23 kg/m   Physical Exam Vitals and nursing note reviewed.  Constitutional:      Appearance: She is not ill-appearing or diaphoretic.  HENT:     Head: Normocephalic and atraumatic.  Eyes:     Conjunctiva/sclera: Conjunctivae normal.  Cardiovascular:     Rate and Rhythm: Normal rate and regular rhythm.     Pulses: Normal pulses.  Pulmonary:     Effort: Pulmonary effort is normal.     Breath sounds: Normal breath sounds. No wheezing, rhonchi or rales.  Abdominal:     Palpations: Abdomen is soft.     Tenderness: There is no abdominal tenderness. There is no right CVA tenderness, left CVA tenderness, guarding or rebound.  Genitourinary:    Comments: Chaperone present for exam 13/11/2018, RN No rashes, lesions, or tenderness to external genitalia. No erythema, injury, or tenderness to vaginal mucosa. Thick white vaginal discharge noted to vault consistent with yeast appearance. No adnexal masses, tenderness, or fullness. No CMT, cervical friability, or discharge from cervical os. Cervical os is closed. Uterus non-deviated, mobile, nonTTP, and without enlargement.  Musculoskeletal:     Cervical back: Neck supple.  Skin:    General: Skin is warm and dry.  Neurological:     Mental Status: She is alert.     ED Results / Procedures / Treatments   Labs (all labs ordered are listed, but only abnormal results are displayed) Labs Reviewed  WET PREP, GENITAL - Abnormal; Notable for the following components:      Result Value   Yeast Wet Prep HPF POC PRESENT (*)    Clue Cells Wet Prep HPF POC PRESENT (*)    WBC, Wet Prep HPF POC MANY (*)    All other components within normal limits  URINALYSIS, ROUTINE W REFLEX MICROSCOPIC - Abnormal; Notable  for the following components:   Specific Gravity, Urine >1.030 (*)    Leukocytes,Ua TRACE (*)    All other components within normal limits  PREGNANCY, URINE - Abnormal; Notable for the following components:   Preg Test, Ur POSITIVE (*)    All other components within normal limits  HCG, QUANTITATIVE, PREGNANCY - Abnormal; Notable for the following components:   hCG, Beta Chain, Quant, S 90,314 (*)    All other components within normal limits  URINALYSIS, MICROSCOPIC (REFLEX) - Abnormal; Notable for the following components:   Bacteria, UA RARE (*)    All other components within normal  limits  RPR  HIV ANTIBODY (ROUTINE TESTING W REFLEX)  GC/CHLAMYDIA PROBE AMP ([place]) NOT AT [facility]    EKG None  Radiology No results found.  Procedures Procedures (including critical care time)  Medications Ordered in ED Medications - No data to display  ED Course  I have reviewed the triage vital signs and the nursing notes.  Pertinent labs & imaging results that were available during my care of the patient were reviewed by me and considered in my medical decision making (see chart for details).  Clinical Course as of Jan 16 2113  Tue Jan 17, 2020  1947 Preg Test, Ur(!): POSITIVE [MV]  2005 Yeast Wet Prep HPF POC(!): PRESENT [MV]  2006 Clue Cells Wet Prep HPF POC(!): PRESENT [MV]    Clinical Course User Index [MV] Tanda Rockers, PA-C   24 year old female who presents to the ED today complaining of vaginal discharge and itching for 1.5 weeks after using antibiotics for cellulitis of left buttock.  She also reports at home positive pregnancy test.  Last normal menstrual period sometime in January.  Patient denies any other concerning symptoms at this time.  No abdominal noticed on exam.  Denies pelvic pain.  Will check urine pride, UA, perform pelvic exam.  Patient requesting HIV and syphilis testing as well.   Pelvic exam performed.  Patient has vaginal discharge consistent with what  appears to be yeast.  Will await on wet prep results.  Urine pregnancy positive.  Will obtain quant for OB/GYN to follow.   Wet prep positive for yeast as well as BV. Findings of clinical exam most consistent with yeast. Do not feel she needs treatment for BV today despite wet prep results. Will treat with clotrimazole cream at this time given pregnancy. Pt advised to followup with OBGYN and to start prenatal vitamins. She is to wait on STI results and to let all partners know if she tests positive. She is advised she can return here for treatment if needbe. Pt is in agreement with plan and stable for discharge home.   This note was prepared using Dragon voice recognition software and may include unintentional dictation errors due to the inherent limitations of voice recognition software.  MDM Rules/Calculators/A&P                       Final Clinical Impression(s) / ED Diagnoses Final diagnoses:  Vaginal yeast infection  Less than [redacted] weeks gestation of pregnancy    Rx / DC Orders ED Discharge Orders         Ordered    clotrimazole (GYNE-LOTRIMIN) 1 % vaginal cream  Daily at bedtime     01/17/20 2111           Discharge Instructions     Please pick up medication and use as prescribed to clear your yeast infection Your pregnancy test was positive today. Please call your OBGYN to schedule an appointment to be seen. Please start prenatal vitamins as soon as possible.        Tanda Rockers, PA-C 01/17/20 2117    Vanetta Mulders, MD 01/20/20 [phone]

## 2020-01-17 NOTE — ED Notes (Signed)
ED Provider at bedside. 

## 2020-01-17 NOTE — Discharge Instructions (Signed)
Please pick up medication and use as prescribed to clear your yeast infection Your pregnancy test was positive today. Please call your OBGYN to schedule an appointment to be seen. Please start prenatal vitamins as soon as possible.

## 2020-01-18 LAB — RPR: RPR Ser Ql: NONREACTIVE

## 2020-01-20 LAB — GC/CHLAMYDIA PROBE AMP (~~LOC~~) NOT AT ARMC
Chlamydia: NEGATIVE
Neisseria Gonorrhea: NEGATIVE

## 2020-03-11 ENCOUNTER — Other Ambulatory Visit: Payer: Self-pay

## 2020-03-11 ENCOUNTER — Encounter (HOSPITAL_BASED_OUTPATIENT_CLINIC_OR_DEPARTMENT_OTHER): Payer: Self-pay | Admitting: Emergency Medicine

## 2020-03-11 ENCOUNTER — Emergency Department (HOSPITAL_BASED_OUTPATIENT_CLINIC_OR_DEPARTMENT_OTHER)
Admission: EM | Admit: 2020-03-11 | Discharge: 2020-03-11 | Disposition: A | Discharge status: Home / Self Care | Payer: Medicaid Other | Attending: Emergency Medicine | Admitting: Emergency Medicine

## 2020-03-11 DIAGNOSIS — L02416 Cutaneous abscess of left lower limb: Secondary | ICD-10-CM | POA: Diagnosis present

## 2020-03-11 DIAGNOSIS — Z79899 Other long term (current) drug therapy: Secondary | ICD-10-CM | POA: Insufficient documentation

## 2020-03-11 DIAGNOSIS — Z91018 Allergy to other foods: Secondary | ICD-10-CM | POA: Insufficient documentation

## 2020-03-11 DIAGNOSIS — L0291 Cutaneous abscess, unspecified: Secondary | ICD-10-CM

## 2020-03-11 DIAGNOSIS — Z91010 Allergy to peanuts: Secondary | ICD-10-CM | POA: Diagnosis not present

## 2020-03-11 DIAGNOSIS — Z91013 Allergy to seafood: Secondary | ICD-10-CM | POA: Diagnosis not present

## 2020-03-11 MED ORDER — DOXYCYCLINE HYCLATE 100 MG PO TABS
100.0000 mg | ORAL_TABLET | Freq: Once | ORAL | Status: AC
  Administered 2020-03-11: 100 mg via ORAL
  Filled 2020-03-11: qty 1

## 2020-03-11 MED ORDER — LIDOCAINE-EPINEPHRINE (PF) 2 %-1:200000 IJ SOLN
10.0000 mL | Freq: Once | INTRAMUSCULAR | Status: AC
  Administered 2020-03-11: 10 mL
  Filled 2020-03-11: qty 10

## 2020-03-11 MED ORDER — DOXYCYCLINE HYCLATE 100 MG PO CAPS: 100.0000 mg | ORAL_CAPSULE | Freq: Two times a day (BID) | ORAL | 0 refills | Status: DC

## 2020-03-11 NOTE — Discharge Instructions (Signed)
Please use warm compresses on your abscess.  Please follow-up with either this department or your primary care doctor in the next 2 days for reevaluation of the abscess to make sure it is healing well.  Please take doxycycline as prescribed.

## 2020-03-11 NOTE — ED Provider Notes (Signed)
[facility] EMERGENCY DEPARTMENT Provider Note   CSN: [id number] Arrival date & time: 03/11/20  1150     History Chief Complaint  Patient presents with  . Abscess    Cheryl Logan is a 24 y.o. female.  HPI Patient is 24 year old female with a past medical history of staph infections, asthma, chlamydia, eczema, GERD  She is presented today with boil/abscess on her left lateral calf which has been present for 3 days.  She states that she was worried that it was a spider bite.  She denies any injuries, abrasions, but does state that she frequently shaves her legs.  Denies any fevers, chills, nausea, vomiting.  She states that the pain is aching, 10/10, worse with touch.  She has taken no medications prior to arrival for the discomfort.     Past Medical History:  Diagnosis Date  . Asthma   . Chlamydia   . Eczema   . GERD (gastroesophageal reflux disease)   . HSV (herpes simplex virus) infection    history of-no current lesions  . Pyelonephritis complicating pregnancy, antepartum 07/17/2014  . Staph infection     Patient Active Problem List   Diagnosis Date Noted  . Indication for care or intervention in labor or delivery 11/02/2016  . Status post primary low transverse cesarean section 10/12/2014  . Normal labor 10/09/2014  . 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 Multiple food allergies--banana, fish oil, peanut 10/09/2014  . Asthma, chronic 10/09/2014  . Eczema 10/09/2014  . GBS (group B streptococcus) UTI complicating pregnancy--07/16/14 08/07/2014  . Short cervix 07/18/2014  . Pyelonephritis complicating pregnancy, antepartum 07/17/2014    Past Surgical History:  Procedure Laterality Date  . CESAREAN SECTION N/A 10/10/2014   Procedure: CESAREAN SECTION;  Surgeon: Purcell Nails, MD;  Location: [facility];  Service: Obstetrics;  Laterality: N/A;  . CESAREAN SECTION N/A 11/02/2016   Procedure: CESAREAN SECTION;  Surgeon: Jaymes Graff, MD;  Location: [facility];  Service: Obstetrics;  Laterality: N/A;  . HERNIA REPAIR    . NO PAST SURGERIES       OB History    Gravida  4   Para  2   Term  2   Preterm      AB      Living  2     SAB      TAB      Ectopic      Multiple  0   Live Births  2           History reviewed. No pertinent family history.  Social History   Tobacco Use  . Smoking status: Never Smoker  . Smokeless tobacco: Never Used  Substance Use Topics  . Alcohol use: Yes    Comment: occ  . Drug use: No    Home Medications Prior to Admission medications   Medication Sig Start Date End Date Taking? Authorizing Provider  albuterol (PROAIR HFA) 108 (90 Base) MCG/ACT inhaler Inhale 2 puffs into the lungs every 6 (six) hours as needed for wheezing or shortness of breath. 09/05/16  Yes Tegeler, Canary Brim, MD  doxycycline (VIBRAMYCIN) 100 MG capsule Take 1 capsule (100 mg total) by mouth 2 (two) times daily. 03/11/20   Nivia Gervase, Rodrigo Ran, PA  fluticasone (FLONASE) 50 MCG/ACT nasal spray Place 1 spray into both nostrils daily. 10/01/19   Law, Waylan Boga, PA-C  loratadine (CLARITIN) 10 MG tablet Take 1 tablet (10 mg total) by mouth daily. 03/13/17   Jeanie Sewer,  PA-C  predniSONE (DELTASONE) 50 MG tablet Take 1 tablet (50 mg total) by mouth daily. 10/01/19   Frederica Kuster, PA-C    Allergies    Fish allergy, Shellfish allergy, Banana, and Peanut-containing drug products  Review of Systems   Review of Systems  Constitutional: Negative for fever.  HENT: Negative for congestion.   Respiratory: Negative for shortness of breath.   Cardiovascular: Negative for chest pain.  Gastrointestinal: Negative for abdominal distention.  Skin: Positive for wound.       Abscess  Neurological: Negative for dizziness and headaches.    Physical Exam Updated Vital Signs BP 105/60 (BP Location: Right Arm)   Pulse 92   Temp 97.8 F (36.6 C)   Resp 18   LMP 02/27/2020 (Approximate)   SpO2 100%   Breastfeeding No    Physical Exam Vitals and nursing note reviewed.  Constitutional:      General: She is not in acute distress.    Appearance: Normal appearance. She is not ill-appearing.  HENT:     Head: Normocephalic and atraumatic.  Eyes:     General: No scleral icterus.       Right eye: No discharge.        Left eye: No discharge.     Conjunctiva/sclera: Conjunctivae normal.  Pulmonary:     Effort: Pulmonary effort is normal.     Breath sounds: No stridor.  Skin:    Comments: Small fluctuant abscess to the left lateral calf.  Surrounding erythema is present.  Neurological:     Mental Status: She is alert and oriented to person, place, and time. Mental status is at baseline.     ED Results / Procedures / Treatments   Labs (all labs ordered are listed, but only abnormal results are displayed) Labs Reviewed - No data to display  EKG None  Radiology No results found.  Procedures .Marland KitchenIncision and Drainage  Date/Time: 03/11/2020 2:06 PM Performed by: Tedd Sias, PA Authorized by: Tedd Sias, PA   Consent:    Consent obtained:  Verbal   Consent given by:  Patient   Risks discussed:  Bleeding, incomplete drainage, pain and damage to other organs   Alternatives discussed:  No treatment Universal protocol:    Procedure explained and questions answered to patient or proxy's satisfaction: yes     Relevant documents present and verified: yes     Test results available and properly labeled: yes     Imaging studies available: yes     Required blood products, implants, devices, and special equipment available: yes     Site/side marked: yes     Immediately prior to procedure a time out was called: yes     Patient identity confirmed:  Verbally with patient Location:    Type:  Abscess   Size:  4cm x 3cm   Location:  Lower extremity   Lower extremity location:  Leg   Leg location:  L lower leg Pre-procedure details:    Skin preparation:  Chloraprep Anesthesia (see MAR for exact  dosages):    Anesthesia method:  Local infiltration   Local anesthetic:  Lidocaine 2% WITH epi Procedure type:    Complexity:  Simple Procedure details:    Incision types:  Single straight   Incision depth:  Subcutaneous   Scalpel blade:  11   Wound management:  Probed and deloculated, irrigated with saline and extensive cleaning   Drainage:  Purulent   Drainage amount:  Moderate   Packing materials:  1/2 in gauze Post-procedure details:    Patient tolerance of procedure:  Tolerated well, no immediate complications   (including critical care time)  Medications Ordered in ED Medications  lidocaine-EPINEPHrine (XYLOCAINE W/EPI) 2 %-1:200000 (PF) injection 10 mL (10 mLs Infiltration Given 03/11/20 1337)  doxycycline (VIBRA-TABS) tablet 100 mg (100 mg Oral Given 03/11/20 1337)    ED Course  I have reviewed the triage vital signs and the nursing notes.  Pertinent labs & imaging results that were available during my care of the patient were reviewed by me and considered in my medical decision making (see chart for details).    MDM Rules/Calculators/A&P                      Patient is 24 year old female with pertinent past medical history detailed above presented today with abscess of the left lower calf. Physical exam is notable for abscess of the left lateral lower Leg She has no systemic symptoms.  Vital signs within her limits. Incision and drainage done with purulent fluid expressed.  Packing placed.  She will follow-up in 2 days for reevaluation.  Discharged on doxycycline.    Final Clinical Impression(s) / ED Diagnoses Final diagnoses:  Abscess  Abscess of left leg    Rx / DC Orders ED Discharge Orders         Ordered    doxycycline (VIBRAMYCIN) 100 MG capsule  2 times daily     03/11/20 1405           Solon Augusta Betances, [place] 03/11/20 1409    Pollyann Savoy, MD 03/11/20 1414

## 2020-03-11 NOTE — ED Triage Notes (Signed)
Pt here with actively draining abscess on lateral left calf.

## 2020-03-15 ENCOUNTER — Other Ambulatory Visit: Payer: Self-pay

## 2020-03-15 ENCOUNTER — Emergency Department (HOSPITAL_BASED_OUTPATIENT_CLINIC_OR_DEPARTMENT_OTHER)
Admission: EM | Admit: 2020-03-15 | Discharge: 2020-03-15 | Disposition: A | Discharge status: Home / Self Care | Payer: Medicaid Other | Attending: Emergency Medicine | Admitting: Emergency Medicine

## 2020-03-15 ENCOUNTER — Encounter (HOSPITAL_BASED_OUTPATIENT_CLINIC_OR_DEPARTMENT_OTHER): Payer: Self-pay | Admitting: *Deleted

## 2020-03-15 DIAGNOSIS — Z4801 Encounter for change or removal of surgical wound dressing: Secondary | ICD-10-CM | POA: Diagnosis present

## 2020-03-15 DIAGNOSIS — L02416 Cutaneous abscess of left lower limb: Secondary | ICD-10-CM | POA: Diagnosis not present

## 2020-03-15 DIAGNOSIS — Z09 Encounter for follow-up examination after completed treatment for conditions other than malignant neoplasm: Secondary | ICD-10-CM

## 2020-03-15 MED ORDER — TRIAMCINOLONE 0.1 % CREAM:EUCERIN CREAM 1:1: 1.0000 "application " | TOPICAL_CREAM | Freq: Two times a day (BID) | CUTANEOUS | 1 refills | Status: DC | PRN

## 2020-03-15 NOTE — ED Provider Notes (Addendum)
[facility] EMERGENCY DEPARTMENT Provider Note   CSN: [id number] Arrival date & time: 03/15/20  0846     History Chief Complaint  Patient presents with  . packing removal    Cheryl Logan is a 24 y.o. female.  24y/o female presenting today for packing removal.  Pt reports ongoing draining and improvement of surrounding swelling and redness.  No worsening symptoms at this time.  Does have hx of prior abscess.  The history is provided by the patient.       Past Medical History:  Diagnosis Date  . Asthma   . Chlamydia   . Eczema   . GERD (gastroesophageal reflux disease)   . HSV (herpes simplex virus) infection    history of-no current lesions  . Pyelonephritis complicating pregnancy, antepartum 07/17/2014  . Staph infection     Patient Active Problem List   Diagnosis Date Noted  . Indication for care or intervention in labor or delivery 11/02/2016  . Status post primary low transverse cesarean section 10/12/2014  . Normal labor 10/09/2014  . 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 Multiple food allergies--banana, fish oil, peanut 10/09/2014  . Asthma, chronic 10/09/2014  . Eczema 10/09/2014  . GBS (group B streptococcus) UTI complicating DPOEUMPNT--04/16/42 08/07/2014  . Short cervix 07/18/2014  . Pyelonephritis complicating pregnancy, antepartum 07/17/2014    Past Surgical History:  Procedure Laterality Date  . CESAREAN SECTION N/A 10/10/2014   Procedure: CESAREAN SECTION;  Surgeon: Delice Lesch, MD;  Location: [facility];  Service: Obstetrics;  Laterality: N/A;  . CESAREAN SECTION N/A 11/02/2016   Procedure: CESAREAN SECTION;  Surgeon: Crawford Givens, MD;  Location: [facility];  Service: Obstetrics;  Laterality: N/A;  . HERNIA REPAIR    . NO PAST SURGERIES       OB History    Gravida  4   Para  2   Term  2   Preterm      AB      Living  2     SAB      TAB      Ectopic      Multiple  0   Live Births  2           History  reviewed. No pertinent family history.  Social History   Tobacco Use  . Smoking status: Never Smoker  . Smokeless tobacco: Never Used  Substance Use Topics  . Alcohol use: Yes    Comment: occ  . Drug use: No    Home Medications Prior to Admission medications   Medication Sig Start Date End Date Taking? Authorizing Provider  albuterol (PROAIR HFA) 108 (90 Base) MCG/ACT inhaler Inhale 2 puffs into the lungs every 6 (six) hours as needed for wheezing or shortness of breath. 09/05/16   Tegeler, Gwenyth Allegra, MD  doxycycline (VIBRAMYCIN) 100 MG capsule Take 1 capsule (100 mg total) by mouth 2 (two) times daily. 03/11/20   Fondaw, Kathleene Hazel, PA  fluticasone (FLONASE) 50 MCG/ACT nasal spray Place 1 spray into both nostrils daily. 10/01/19   Law, Bea Graff, PA-C  loratadine (CLARITIN) 10 MG tablet Take 1 tablet (10 mg total) by mouth daily. 03/13/17   Fawze, Mina A, PA-C  predniSONE (DELTASONE) 50 MG tablet Take 1 tablet (50 mg total) by mouth daily. 10/01/19   Law, Bea Graff, PA-C  Triamcinolone Acetonide (TRIAMCINOLONE 0.1 % CREAM : EUCERIN) CREA Apply 1 application topically 2 (two) times daily as needed. 03/15/20   Blanchie Dessert, MD  Allergies    Fish allergy, Shellfish allergy, Banana, and Peanut-containing drug products  Review of Systems   Review of Systems  All other systems reviewed and are negative.   Physical Exam Updated Vital Signs BP 127/64 (BP Location: Right Arm)   Pulse 88   Temp 98.9 F (37.2 C) (Oral)   Resp 16   Ht 5\' 2"  (1.575 m)   Wt 70.3 kg   LMP 02/27/2020 (Approximate)   SpO2 98%   BMI 28.35 kg/m   Physical Exam Vitals and nursing note reviewed.  Constitutional:      Appearance: Normal appearance. She is normal weight.  Cardiovascular:     Rate and Rhythm: Normal rate.  Pulmonary:     Effort: Pulmonary effort is normal.  Musculoskeletal:     Comments: Left lower leg with draining abscess.  Packing in place and when removed, granulation  tissue present.  No surrounding erythema or warmth.  Skin:    General: Skin is warm and dry.  Neurological:     General: No focal deficit present.     Mental Status: She is alert.  Psychiatric:        Mood and Affect: Mood normal.     ED Results / Procedures / Treatments   Labs (all labs ordered are listed, but only abnormal results are displayed) Labs Reviewed - No data to display  EKG None  Radiology No results found.  Procedures Procedures (including critical care time)  Medications Ordered in ED Medications - No data to display  ED Course  I have reviewed the triage vital signs and the nursing notes.  Pertinent labs & imaging results that were available during my care of the patient were reviewed by me and considered in my medical decision making (see chart for details).    MDM Rules/Calculators/A&P                      Packing removed from abscess and healing well.  No need for further intervention at this time.  Final Clinical Impression(s) / ED Diagnoses Final diagnoses:  Encounter for recheck of abscess following incision and drainage    Rx / DC Orders ED Discharge Orders         Ordered    Triamcinolone Acetonide (TRIAMCINOLONE 0.1 % CREAM : EUCERIN) CREA  2 times daily PRN,   Status:  Discontinued     03/15/20 0920    Triamcinolone Acetonide (TRIAMCINOLONE 0.1 % CREAM : EUCERIN) CREA  2 times daily PRN     03/15/20 03/17/20           [id number], MD 03/15/20 0945    03/17/20, MD 04/03/20 2202

## 2020-03-15 NOTE — Discharge Instructions (Signed)
Continue to shower and the area can be washed with soap and water

## 2020-03-15 NOTE — ED Triage Notes (Signed)
Had I&D of LLE, here for wound re-evaluation and possible removal of packing

## 2020-11-14 ENCOUNTER — Emergency Department (HOSPITAL_BASED_OUTPATIENT_CLINIC_OR_DEPARTMENT_OTHER)
Admission: EM | Admit: 2020-11-14 | Discharge: 2020-11-15 | Disposition: A | Discharge status: Home / Self Care | Payer: Medicaid Other | Attending: Emergency Medicine | Admitting: Emergency Medicine

## 2020-11-14 ENCOUNTER — Other Ambulatory Visit: Payer: Self-pay

## 2020-11-14 ENCOUNTER — Emergency Department (HOSPITAL_BASED_OUTPATIENT_CLINIC_OR_DEPARTMENT_OTHER): Payer: Medicaid Other

## 2020-11-14 ENCOUNTER — Encounter (HOSPITAL_BASED_OUTPATIENT_CLINIC_OR_DEPARTMENT_OTHER): Payer: Self-pay

## 2020-11-14 DIAGNOSIS — U071 COVID-19: Secondary | ICD-10-CM | POA: Diagnosis not present

## 2020-11-14 DIAGNOSIS — Z7951 Long term (current) use of inhaled steroids: Secondary | ICD-10-CM | POA: Insufficient documentation

## 2020-11-14 DIAGNOSIS — J45909 Unspecified asthma, uncomplicated: Secondary | ICD-10-CM | POA: Insufficient documentation

## 2020-11-14 DIAGNOSIS — R0789 Other chest pain: Secondary | ICD-10-CM

## 2020-11-14 DIAGNOSIS — Z91010 Allergy to peanuts: Secondary | ICD-10-CM | POA: Diagnosis not present

## 2020-11-14 DIAGNOSIS — R072 Precordial pain: Secondary | ICD-10-CM | POA: Diagnosis present

## 2020-11-14 LAB — CBC
HCT: 41.9 % (ref 36.0–46.0)
Hemoglobin: 13.2 g/dL (ref 12.0–15.0)
MCH: 25.3 pg — ABNORMAL LOW (ref 26.0–34.0)
MCHC: 31.5 g/dL (ref 30.0–36.0)
MCV: 80.4 fL (ref 80.0–100.0)
Platelets: 254 10*3/uL (ref 150–400)
RBC: 5.21 MIL/uL — ABNORMAL HIGH (ref 3.87–5.11)
RDW: 14.5 % (ref 11.5–15.5)
WBC: 3.2 10*3/uL — ABNORMAL LOW (ref 4.0–10.5)
nRBC: 0 % (ref 0.0–0.2)

## 2020-11-14 LAB — BASIC METABOLIC PANEL
Anion gap: 10 (ref 5–15)
BUN: 7 mg/dL (ref 6–20)
CO2: 22 mmol/L (ref 22–32)
Calcium: 9.1 mg/dL (ref 8.9–10.3)
Chloride: 102 mmol/L (ref 98–111)
Creatinine, Ser: 0.94 mg/dL (ref 0.44–1.00)
GFR, Estimated: >60 mL/min (ref >60)
Glucose, Bld: 84 mg/dL (ref 70–99)
Potassium: 3.9 mmol/L (ref 3.5–5.1)
Sodium: 134 mmol/L — ABNORMAL LOW (ref 135–145)

## 2020-11-14 LAB — TROPONIN I (HIGH SENSITIVITY)
Troponin I (High Sensitivity): <2 ng/L (ref <18)
Troponin I (High Sensitivity): <2 ng/L (ref <18)

## 2020-11-14 LAB — PREGNANCY, URINE: Preg Test, Ur: NEGATIVE

## 2020-11-14 MED ORDER — NAPROXEN 375 MG PO TABS: ORAL_TABLET | ORAL | 0 refills | Status: DC

## 2020-11-14 MED ORDER — ACETAMINOPHEN 325 MG PO TABS
650.0000 mg | ORAL_TABLET | Freq: Once | ORAL | Status: DC | PRN
  Filled 2020-11-14: qty 2

## 2020-11-14 MED ORDER — NAPROXEN 250 MG PO TABS
500.0000 mg | ORAL_TABLET | Freq: Once | ORAL | Status: AC
  Administered 2020-11-14: 500 mg via ORAL
  Filled 2020-11-14: qty 2

## 2020-11-14 NOTE — ED Notes (Signed)
Pt not requesting an IV insertion at this time. Pt agreeable to Repeat Blood Specimen Draws.

## 2020-11-14 NOTE — ED Provider Notes (Signed)
[facility] Provider Note: Lowella Dell, MD, FACEP  CSN: [id number] MRN: [id number] ARRIVAL: 11/14/20 at 1843 ROOM: MH10/MH10   CHIEF COMPLAINT  Chest Pain   HISTORY OF PRESENT ILLNESS  11/14/20 11:32 PM [name] is a 25 y.o. female who awakened from a nap about 2 PM with pain in the center of her chest.  She describes it as feeling like something was sitting on her chest.  She reports it is 8 out of 10.  It is worse with palpation or movement.  She has had no associated cough, shortness of breath, nausea, vomiting, diarrhea or diaphoresis.  She did have a temperature of 100.4 on arrival.  She has a history of asthma and used her inhaler earlier without relief of her discomfort.   Past Medical History:  Diagnosis Date  . Asthma   . Chlamydia   . Eczema   . GERD (gastroesophageal reflux disease)   . HSV (herpes simplex virus) infection    history of-no current lesions  . Pyelonephritis complicating pregnancy, antepartum 07/17/2014  . Staph infection     Past Surgical History:  Procedure Laterality Date  . CESAREAN SECTION N/A 10/10/2014   Procedure: CESAREAN SECTION;  Surgeon: Purcell Nails, MD;  Location: [facility];  Service: Obstetrics;  Laterality: N/A;  . CESAREAN SECTION N/A 11/02/2016   Procedure: CESAREAN SECTION;  Surgeon: Jaymes Graff, MD;  Location: [facility];  Service: Obstetrics;  Laterality: N/A;  . HERNIA REPAIR    . NO PAST SURGERIES      History reviewed. No pertinent family history.  Social History   Tobacco Use  . Smoking status: Never Smoker  . Smokeless tobacco: Never Used  Vaping Use  . Vaping Use: Never used  Substance Use Topics  . Alcohol use: Yes    Comment: occ  . Drug use: No    Prior to Admission medications   Medication Sig Start Date End Date Taking? Authorizing Provider  albuterol (PROAIR HFA) 108 (90 Base) MCG/ACT inhaler Inhale 2 puffs into the lungs every 6 (six) hours as needed for wheezing or  shortness of breath. 09/05/16   Tegeler, Canary Brim, MD  fluticasone (FLONASE) 50 MCG/ACT nasal spray Place 1 spray into both nostrils daily. 10/01/19 11/14/20  Emi Holes, PA-C  loratadine (CLARITIN) 10 MG tablet Take 1 tablet (10 mg total) by mouth daily. 03/13/17 11/14/20  Michela Pitcher A, PA-C    Allergies Fish allergy, Shellfish allergy, Banana, and Peanut-containing drug products   REVIEW OF SYSTEMS  Negative except as noted here or in the History of Present Illness.   PHYSICAL EXAMINATION  Initial Vital Signs Blood pressure 101/64, pulse 89, temperature 99.4 F (37.4 C), temperature source Oral, resp. rate 20, height 5\' 2"  (1.575 m), weight 70.3 kg, last menstrual period 10/30/2020, SpO2 98 %.  Examination General: Well-developed, well-nourished female in no acute distress; appearance consistent with age of record HENT: normocephalic; atraumatic Eyes: pupils equal, round and reactive to light; extraocular muscles intact Neck: supple Heart: regular rate and rhythm Lungs: clear to auscultation bilaterally Abdomen: soft; nondistended; nontender; bowel sounds present Extremities: No deformity; full range of motion; pulses normal Neurologic: Awake, alert and oriented; motor function intact in all extremities and symmetric; no facial droop Skin: Warm and dry Psychiatric: Normal mood and affect   RESULTS  Summary of this visit's results, reviewed and interpreted by myself:   EKG Interpretation  Date/Time:  Wednesday November 14 2020 18:43:43 EST Ventricular Rate:  110  PR Interval:  132 QRS Duration: 66 QT Interval:  304 QTC Calculation: 411 R Axis:   66 Text Interpretation: Sinus tachycardia Otherwise normal ECG No previous ECGs available Confirmed by Levaughn Puccinelli, Jonny Ruiz ([id number]) on 11/14/2020 11:32:42 PM      Laboratory Studies: Results for orders placed or performed during the hospital encounter of 11/14/20 (from the past 24 hour(s))  Basic metabolic panel     Status:  Abnormal   Collection Time: 11/14/20  7:36 PM  Result Value Ref Range   Sodium 134 (L) 135 - 145 mmol/L   Potassium 3.9 3.5 - 5.1 mmol/L   Chloride 102 98 - 111 mmol/L   CO2 22 22 - 32 mmol/L   Glucose, Bld 84 70 - 99 mg/dL   BUN 7 6 - 20 mg/dL   Creatinine, Ser 3.15 0.44 - 1.00 mg/dL   Calcium 9.1 8.9 - 40.0 mg/dL   GFR, Estimated >86 >76 mL/min   Anion gap 10 5 - 15  CBC     Status: Abnormal   Collection Time: 11/14/20  7:36 PM  Result Value Ref Range   WBC 3.2 (L) 4.0 - 10.5 K/uL   RBC 5.21 (H) 3.87 - 5.11 MIL/uL   Hemoglobin 13.2 12.0 - 15.0 g/dL   HCT 19.5 09.3 - 26.7 %   MCV 80.4 80.0 - 100.0 fL   MCH 25.3 (L) 26.0 - 34.0 pg   MCHC 31.5 30.0 - 36.0 g/dL   RDW 12.4 58.0 - 99.8 %   Platelets 254 150 - 400 K/uL   nRBC 0.0 0.0 - 0.2 %  Troponin I (High Sensitivity)     Status: None   Collection Time: 11/14/20  7:36 PM  Result Value Ref Range   Troponin I (High Sensitivity) <2 <18 ng/L  Pregnancy, urine     Status: None   Collection Time: 11/14/20 10:20 PM  Result Value Ref Range   Preg Test, Ur NEGATIVE NEGATIVE   Imaging Studies: DG Chest Portable 1 View  Result Date: 11/14/2020 CLINICAL DATA:  Chest pain EXAM: PORTABLE CHEST 1 VIEW COMPARISON:  10/01/2019 FINDINGS: Heart and mediastinal contours are within normal limits. No focal opacities or effusions. No acute bony abnormality. IMPRESSION: No active disease. Electronically Signed   By: Charlett Nose M.D.   On: 11/14/2020 20:01    ED COURSE and MDM  Nursing notes, initial and subsequent vitals signs, including pulse oximetry, reviewed and interpreted by myself.  Vitals:   11/14/20 1851 11/14/20 2027 11/14/20 2213 11/14/20 2322  BP:  110/64 107/70 101/64  Pulse:  100 94 89  Resp:  20 18 20   Temp:   99.4 F (37.4 C)   TempSrc:   Oral   SpO2:  99% 100% 98%  Weight: 70.3 kg     Height: 5\' 2"  (1.575 m)      Medications  acetaminophen (TYLENOL) tablet 650 mg (has no administration in time range)  naproxen  (NAPROSYN) tablet 500 mg (has no administration in time range)   The patient's presentation is atypical for cardiac etiology and she is in a low risk group at age 55.  Examination shows sternal tenderness more consistent with costochondritis.  Also she had a low-grade fever on arrival and we have been seeing chest wall pain as a complication of COVID infection recently.   PROCEDURES  Procedures   ED DIAGNOSES     ICD-10-CM   1. Chest wall pain  R07.89        Violia Knopf, MD  11/14/20 2344  

## 2020-11-14 NOTE — ED Triage Notes (Signed)
Pt reports having chest pressure since 2 pm. Pt awoke from a nap and felt like "I was suffocating" as she has hx of asthma. Pt reported that "it felt like something was sitting on my chest" Pt also reports headache

## 2020-11-15 LAB — SARS CORONAVIRUS 2 (TAT 6-24 HRS): SARS Coronavirus 2: POSITIVE — AB

## 2020-11-15 NOTE — ED Notes (Signed)
Patient verbalizes understanding of discharge instructions. Opportunity for questioning and answers were provided. Armband removed by staff, pt discharged from ED ambulatory to home.  

## 2020-11-16 ENCOUNTER — Telehealth: Payer: Self-pay | Admitting: Nurse Practitioner

## 2020-11-16 NOTE — Telephone Encounter (Signed)
Called to discuss with patient about COVID-19 symptoms and the use of one of the available treatments for those with mild to moderate Covid symptoms and at a high risk of hospitalization.  Pt appears to qualify for outpatient treatment due to co-morbid conditions and/or a member of an at-risk group in accordance with the FDA Emergency Use Authorization.    Symptom onset: 1/12 Vaccinated: unknown Booster? unknown Immunocompromised? no Qualifiers: BMI, active asthma, SVI  Unable to reach pt - left voicemail and MyChart message sent  Trinidad Curet

## 2020-11-19 ENCOUNTER — Telehealth: Payer: Self-pay | Admitting: Physician Assistant

## 2020-11-19 NOTE — Telephone Encounter (Signed)
Called todiscuss with patient about COVID-19 symptoms and the use of one of the available treatments for those with mild to moderate Covid symptoms and at a high risk of hospitalization. Pt appears to qualify for outpatient treatment due to co-morbid conditions and/or a member of an at-risk group in accordance with the FDA Emergency Use Authorization.   Symptom onset: 1/12 Vaccinated: unknown Booster? unknown Immunocompromised? no Qualifiers: BMI, active asthma, SVI  Pt mychart message Kasie back to call her. Unable to reach pt - left voicemail    Cline Crock PA-C  [facility]

## 2020-11-22 ENCOUNTER — Encounter (HOSPITAL_BASED_OUTPATIENT_CLINIC_OR_DEPARTMENT_OTHER): Payer: Self-pay | Admitting: *Deleted

## 2020-11-22 ENCOUNTER — Other Ambulatory Visit: Payer: Self-pay

## 2020-11-22 ENCOUNTER — Emergency Department (HOSPITAL_BASED_OUTPATIENT_CLINIC_OR_DEPARTMENT_OTHER)
Admission: EM | Admit: 2020-11-22 | Discharge: 2020-11-22 | Disposition: A | Discharge status: Home / Self Care | Payer: Medicaid Other | Attending: Emergency Medicine | Admitting: Emergency Medicine

## 2020-11-22 ENCOUNTER — Telehealth (HOSPITAL_COMMUNITY): Payer: Self-pay

## 2020-11-22 DIAGNOSIS — Z91010 Allergy to peanuts: Secondary | ICD-10-CM | POA: Insufficient documentation

## 2020-11-22 DIAGNOSIS — U071 COVID-19: Secondary | ICD-10-CM

## 2020-11-22 DIAGNOSIS — Z7951 Long term (current) use of inhaled steroids: Secondary | ICD-10-CM | POA: Diagnosis not present

## 2020-11-22 DIAGNOSIS — J45909 Unspecified asthma, uncomplicated: Secondary | ICD-10-CM | POA: Diagnosis not present

## 2020-11-22 DIAGNOSIS — R079 Chest pain, unspecified: Secondary | ICD-10-CM | POA: Diagnosis present

## 2020-11-22 MED ORDER — ALBUTEROL SULFATE HFA 108 (90 BASE) MCG/ACT IN AERS: 2.0000 | INHALATION_SPRAY | Freq: Once | RESPIRATORY_TRACT | Status: DC

## 2020-11-22 MED ORDER — METOCLOPRAMIDE HCL 10 MG PO TABS: 10.0000 mg | ORAL_TABLET | Freq: Four times a day (QID) | ORAL | 0 refills | Status: DC | PRN

## 2020-11-22 MED ORDER — PREDNISONE 20 MG PO TABS: ORAL_TABLET | ORAL | 0 refills | Status: AC

## 2020-11-22 MED ORDER — METOCLOPRAMIDE HCL 10 MG PO TABS
10.0000 mg | ORAL_TABLET | Freq: Once | ORAL | Status: AC
  Administered 2020-11-22: 10 mg via ORAL
  Filled 2020-11-22: qty 1

## 2020-11-22 MED ORDER — PREDNISONE 50 MG PO TABS
60.0000 mg | ORAL_TABLET | Freq: Once | ORAL | Status: AC
  Administered 2020-11-22: 60 mg via ORAL
  Filled 2020-11-22: qty 1

## 2020-11-22 NOTE — Telephone Encounter (Signed)
Called to discuss with patient about COVID-19 symptoms and the use of one of the available treatments for those with mild to moderate Covid symptoms and at a high risk of hospitalization.  Pt does not appear to qualify for outpatient treatment due to co-morbid conditions and/or a member of an at-risk group in accordance with the FDA Emergency Use Authorization.    Symptom onset: 1/11-1/12 Vaccinated: No Booster: No Immunocompromised: No Qualifiers: SVI, asthma  RN returned patient's call and informed her she was out of the sx range for treatment. Pt c/o chest pain, 8/10 and sometimes 10/10. RN informed pt that she needs to go to urgent care or the ED for evaluation. Post [facility] number also provided to patient. Pt understands without assistance.   Essie Hart, RN

## 2020-11-22 NOTE — Discharge Instructions (Signed)
You have COVID and your symptoms are consistent with a COVID infection.  Take Reglan for headaches.  Continue Tylenol or Motrin for fevers.  Take prednisone as prescribed.  Use your albuterol if you have shortness of breath  Follow-up with the post-COVID clinic will contact you today   See your doctor for follow-up.  Please get a pulse ox and return to the ER if your oxygen is less than 90% or you have severe trouble breathing

## 2020-11-22 NOTE — ED Triage Notes (Signed)
Covid Positive last week. Here for chest pain x 2 days.

## 2020-11-22 NOTE — Telephone Encounter (Signed)
Called to discuss with patient about COVID-19 symptoms and the use of one of the available treatments for those with mild to moderate Covid symptoms and at a high risk of hospitalization.  Pt may appear to qualify for outpatient treatment due to co-morbid conditions and/or a member of an at-risk group in accordance with the FDA Emergency Use Authorization.     Unable to reach pt - LVM @ 1353  Essie Hart, RN

## 2020-11-22 NOTE — ED Provider Notes (Signed)
[facility] EMERGENCY DEPARTMENT Provider Note   CSN: [id number] Arrival date & time: 11/22/20  1512     History Chief Complaint  Patient presents with  . Covid Positive    Cheryl Logan is a 25 y.o. female history of reflux here presenting with chest pain.  Patient was seen in the ED on 1/12 with chest pain.  Patient was diagnosed with COVID at that time I thought was having costochondritis causing her chest pain. Patient states that she has persistent fevers and persistent pain. She states that the pain is worse with movement.  She has persistent shortness of breath as well.  She states that the infusion center called her today and she does not qualify for any infusion right now.  She told them that she had chest pain so she was sent here for evaluation.  Of note her troponin was negative during last visit and she has no known CAD.  The history is provided by the patient.       Past Medical History:  Diagnosis Date  . Asthma   . Chlamydia   . Eczema   . GERD (gastroesophageal reflux disease)   . HSV (herpes simplex virus) infection    history of-no current lesions  . Pyelonephritis complicating pregnancy, antepartum 07/17/2014  . Staph infection     Patient Active Problem List   Diagnosis Date Noted  . Indication for care or intervention in labor or delivery 11/02/2016  . Status post primary low transverse cesarean section 10/12/2014  . Normal labor 10/09/2014  . 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 Multiple food allergies--banana, fish oil, peanut 10/09/2014  . Asthma, chronic 10/09/2014  . Eczema 10/09/2014  . GBS (group B streptococcus) UTI complicating pregnancy--07/16/14 08/07/2014  . Short cervix 07/18/2014  . Pyelonephritis complicating pregnancy, antepartum 07/17/2014    Past Surgical History:  Procedure Laterality Date  . CESAREAN SECTION N/A 10/10/2014   Procedure: CESAREAN SECTION;  Surgeon: Purcell Nails, MD;  Location: [facility];  Service: Obstetrics;   Laterality: N/A;  . CESAREAN SECTION N/A 11/02/2016   Procedure: CESAREAN SECTION;  Surgeon: Jaymes Graff, MD;  Location: [facility];  Service: Obstetrics;  Laterality: N/A;  . HERNIA REPAIR    . NO PAST SURGERIES       OB History    Gravida  4   Para  2   Term  2   Preterm      AB      Living  2     SAB      IAB      Ectopic      Multiple  0   Live Births  2           No family history on file.  Social History   Tobacco Use  . Smoking status: Never Smoker  . Smokeless tobacco: Never Used  Vaping Use  . Vaping Use: Never used  Substance Use Topics  . Alcohol use: Yes    Comment: occ  . Drug use: No    Home Medications Prior to Admission medications   Medication Sig Start Date End Date Taking? Authorizing Provider  albuterol (PROAIR HFA) 108 (90 Base) MCG/ACT inhaler Inhale 2 puffs into the lungs every 6 (six) hours as needed for wheezing or shortness of breath. 09/05/16  Yes Tegeler, Canary Brim, MD  naproxen (NAPROSYN) 375 MG tablet Take 1 tablet twice daily as needed for chest wall pain. 11/14/20  Yes Molpus, Jonny Ruiz, MD  fluticasone Aleda Grana)  50 MCG/ACT nasal spray Place 1 spray into both nostrils daily. 10/01/19 11/14/20  Emi Holes, PA-C  loratadine (CLARITIN) 10 MG tablet Take 1 tablet (10 mg total) by mouth daily. 03/13/17 11/14/20  Michela Pitcher A, PA-C    Allergies    Fish allergy, Shellfish allergy, Banana, and Peanut-containing drug products  Review of Systems   Review of Systems  Cardiovascular: Positive for chest pain.  All other systems reviewed and are negative.   Physical Exam Updated Vital Signs BP 104/69 (BP Location: Right Arm)   Pulse 71   Temp 98.3 F (36.8 C) (Oral)   Resp (!) 21   Ht 5\' 2"  (1.575 m)   Wt 70.7 kg   LMP 11/20/2020   SpO2 100%   BMI 28.51 kg/m   Physical Exam Vitals and nursing note reviewed.  Constitutional:      Comments: Comfortable  HENT:     Head: Normocephalic.     Nose: Nose  normal.     Mouth/Throat:     Mouth: Mucous membranes are moist.  Eyes:     Extraocular Movements: Extraocular movements intact.     Pupils: Pupils are equal, round, and reactive to light.  Cardiovascular:     Rate and Rhythm: Normal rate and regular rhythm.     Pulses: Normal pulses.     Heart sounds: Normal heart sounds.  Pulmonary:     Effort: Pulmonary effort is normal.     Breath sounds: Normal breath sounds.     Comments: Reproducible chest wall tenderness Abdominal:     General: Abdomen is flat.     Palpations: Abdomen is soft.  Musculoskeletal:        General: Normal range of motion.     Cervical back: Normal range of motion and neck supple.  Skin:    General: Skin is warm.     Capillary Refill: Capillary refill takes less than 2 seconds.  Neurological:     General: No focal deficit present.     Mental Status: She is alert and oriented to person, place, and time.  Psychiatric:        Mood and Affect: Mood normal.        Behavior: Behavior normal.     ED Results / Procedures / Treatments   Labs (all labs ordered are listed, but only abnormal results are displayed) Labs Reviewed - No data to display  EKG EKG Interpretation  Date/Time:  Thursday November 22 2020 15:14:18 EST Ventricular Rate:  73 PR Interval:  158 QRS Duration: 70 QT Interval:  362 QTC Calculation: 398 R Axis:   69 Text Interpretation: Normal sinus rhythm Normal ECG No significant change since last tracing Confirmed by 1974 [phone]) on 11/22/2020 6:59:13 PM   Radiology No results found.  Procedures Procedures (including critical care time)  Medications Ordered in ED Medications  predniSONE (DELTASONE) tablet 60 mg (has no administration in time range)  metoCLOPramide (REGLAN) tablet 10 mg (has no administration in time range)    ED Course  I have reviewed the triage vital signs and the nursing notes.  Pertinent labs & imaging results that were available during my care of the  patient were reviewed by me and considered in my medical decision making (see chart for details).    MDM Rules/Calculators/A&P                         11/24/2020 [name] is a 25 y.o. female here  presenting with persistent chest pain.  Was diagnosed with COVID on January 12.  Patient is not hypoxic and is not tachycardic.  Patient is well-appearing on exam.  I think patient likely has some costochondritis versus mild asthma exacerbation.  Patient has an albuterol pump at home.  We will give a course of steroids.  Told her to get a pulse ox and return if her oxygen is less than 90%   Final Clinical Impression(s) / ED Diagnoses Final diagnoses:  None    Rx / DC Orders ED Discharge Orders    None       Charlynne Pander, MD 11/22/20 [phone]

## 2021-03-12 ENCOUNTER — Other Ambulatory Visit (HOSPITAL_BASED_OUTPATIENT_CLINIC_OR_DEPARTMENT_OTHER): Payer: Self-pay

## 2021-03-12 ENCOUNTER — Emergency Department (HOSPITAL_BASED_OUTPATIENT_CLINIC_OR_DEPARTMENT_OTHER)
Admission: EM | Admit: 2021-03-12 | Discharge: 2021-03-12 | Disposition: A | Discharge status: Home / Self Care | Payer: Medicaid Other | Attending: Emergency Medicine | Admitting: Emergency Medicine

## 2021-03-12 ENCOUNTER — Encounter (HOSPITAL_BASED_OUTPATIENT_CLINIC_OR_DEPARTMENT_OTHER): Payer: Self-pay

## 2021-03-12 ENCOUNTER — Other Ambulatory Visit: Payer: Self-pay

## 2021-03-12 DIAGNOSIS — R059 Cough, unspecified: Secondary | ICD-10-CM | POA: Diagnosis present

## 2021-03-12 DIAGNOSIS — J069 Acute upper respiratory infection, unspecified: Secondary | ICD-10-CM | POA: Diagnosis not present

## 2021-03-12 DIAGNOSIS — J45909 Unspecified asthma, uncomplicated: Secondary | ICD-10-CM | POA: Insufficient documentation

## 2021-03-12 MED ORDER — ALBUTEROL SULFATE HFA 108 (90 BASE) MCG/ACT IN AERS
2.0000 | INHALATION_SPRAY | Freq: Once | RESPIRATORY_TRACT | Status: AC
  Administered 2021-03-12: 2 via RESPIRATORY_TRACT
  Filled 2021-03-12: qty 6.7

## 2021-03-12 MED ORDER — PREDNISONE 20 MG PO TABS: ORAL_TABLET | ORAL | 0 refills | Status: DC

## 2021-03-12 MED ORDER — PREDNISONE 50 MG PO TABS
60.0000 mg | ORAL_TABLET | Freq: Once | ORAL | Status: AC
  Administered 2021-03-12: 60 mg via ORAL
  Filled 2021-03-12: qty 1

## 2021-03-12 MED ORDER — PREDNISONE 20 MG PO TABS
ORAL_TABLET | ORAL | 0 refills | Status: DC
  Filled 2021-03-12: qty 8, 4d supply, fill #0

## 2021-03-12 NOTE — ED Notes (Signed)
RT gave patient MDI and spacer to take home. No instruction needed as she already takes it. Her home MDI was empty

## 2021-03-12 NOTE — ED Provider Notes (Signed)
[facility] EMERGENCY DEPARTMENT Provider Note   CSN: [id number] Arrival date & time: 03/12/21  4098     History Chief Complaint  Patient presents with  . Asthma    Cheryl Logan is a 25 y.o. female.  25 yo F with a chief complaints of presumed asthma exacerbation.  Going on for a couple days.  Has been using her inhaler much more often than normal.  She has had a cough especially at night noticed last night.  She has a tightness in her chest that feels typical of her asthma exacerbations.  Denies fevers.  Denies sick contacts.  The history is provided by the patient.  Asthma This is a new problem. The current episode started 2 days ago. The problem occurs constantly. The problem has not changed since onset.Associated symptoms include shortness of breath. Pertinent negatives include no chest pain and no headaches. Nothing aggravates the symptoms. Nothing relieves the symptoms. She has tried nothing for the symptoms. The treatment provided no relief.       Past Medical History:  Diagnosis Date  . Asthma   . Chlamydia   . Eczema   . GERD (gastroesophageal reflux disease)   . HSV (herpes simplex virus) infection    history of-no current lesions  . Pyelonephritis complicating pregnancy, antepartum 07/17/2014  . Staph infection     Patient Active Problem List   Diagnosis Date Noted  . Indication for care or intervention in labor or delivery 11/02/2016  . Status post primary low transverse cesarean section 10/12/2014  . Normal labor 10/09/2014  . 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 Multiple food allergies--banana, fish oil, peanut 10/09/2014  . Asthma, chronic 10/09/2014  . Eczema 10/09/2014  . GBS (group B streptococcus) UTI complicating pregnancy--07/16/14 08/07/2014  . Short cervix 07/18/2014  . Pyelonephritis complicating pregnancy, antepartum 07/17/2014    Past Surgical History:  Procedure Laterality Date  . CESAREAN SECTION N/A 10/10/2014   Procedure: CESAREAN  SECTION;  Surgeon: Purcell Nails, MD;  Location: [facility];  Service: Obstetrics;  Laterality: N/A;  . CESAREAN SECTION N/A 11/02/2016   Procedure: CESAREAN SECTION;  Surgeon: Jaymes Graff, MD;  Location: [facility];  Service: Obstetrics;  Laterality: N/A;  . HERNIA REPAIR    . NO PAST SURGERIES       OB History    Gravida  4   Para  2   Term  2   Preterm      AB      Living  2     SAB      IAB      Ectopic      Multiple  0   Live Births  2           History reviewed. No pertinent family history.  Social History   Tobacco Use  . Smoking status: Never Smoker  . Smokeless tobacco: Never Used  Vaping Use  . Vaping Use: Never used  Substance Use Topics  . Alcohol use: Yes    Comment: occ  . Drug use: No    Home Medications Prior to Admission medications   Medication Sig Start Date End Date Taking? Authorizing Provider  albuterol (PROAIR HFA) 108 (90 Base) MCG/ACT inhaler Inhale 2 puffs into the lungs every 6 (six) hours as needed for wheezing or shortness of breath. 09/05/16  Yes Tegeler, Canary Brim, MD  metoCLOPramide (REGLAN) 10 MG tablet Take 1 tablet (10 mg total) by mouth every 6 (six) hours as needed for  nausea (nausea/headache). 11/22/20   Charlynne Pander, MD  naproxen (NAPROSYN) 375 MG tablet Take 1 tablet twice daily as needed for chest wall pain. 11/14/20   Molpus, John, MD  predniSONE (DELTASONE) 20 MG tablet 2 tabs po daily x 4 days 03/12/21   Melene Plan, DO  fluticasone [facility]) 50 MCG/ACT nasal spray Place 1 spray into both nostrils daily. 10/01/19 11/14/20  Emi Holes, PA-C  loratadine (CLARITIN) 10 MG tablet Take 1 tablet (10 mg total) by mouth daily. 03/13/17 11/14/20  Michela Pitcher A, PA-C    Allergies    Fish allergy, Shellfish allergy, Banana, and Peanut-containing drug products  Review of Systems   Review of Systems  Constitutional: Negative for chills and fever.  HENT: Negative for congestion and rhinorrhea.   Eyes:  Negative for redness and visual disturbance.  Respiratory: Positive for cough, shortness of breath and wheezing.   Cardiovascular: Negative for chest pain and palpitations.  Gastrointestinal: Negative for nausea and vomiting.  Genitourinary: Negative for dysuria and urgency.  Musculoskeletal: Negative for arthralgias and myalgias.  Skin: Negative for pallor and wound.  Neurological: Negative for dizziness and headaches.    Physical Exam Updated Vital Signs BP 116/68 (BP Location: Right Arm)   Pulse 93   Temp 98.1 F (36.7 C) (Oral)   Resp 20   Ht 5\' 2"  (1.575 m)   Wt 68 kg   SpO2 99%   BMI 27.44 kg/m   Physical Exam Vitals and nursing note reviewed.  Constitutional:      General: She is not in acute distress.    Appearance: She is well-developed. She is not diaphoretic.  HENT:     Head: Normocephalic and atraumatic.     Comments: Swollen turbinates, posterior nasal drip Eyes:     Pupils: Pupils are equal, round, and reactive to light.  Cardiovascular:     Rate and Rhythm: Normal rate and regular rhythm.     Heart sounds: No murmur heard. No friction rub. No gallop.   Pulmonary:     Effort: Pulmonary effort is normal.     Breath sounds: No wheezing or rales.  Abdominal:     General: There is no distension.     Palpations: Abdomen is soft.     Tenderness: There is no abdominal tenderness.  Musculoskeletal:        General: No tenderness.     Cervical back: Normal range of motion and neck supple.  Skin:    General: Skin is warm and dry.  Neurological:     Mental Status: She is alert and oriented to person, place, and time.  Psychiatric:        Behavior: Behavior normal.     ED Results / Procedures / Treatments   Labs (all labs ordered are listed, but only abnormal results are displayed) Labs Reviewed - No data to display  EKG None  Radiology No results found.  Procedures Procedures   Medications Ordered in ED Medications  predniSONE (DELTASONE)  tablet 60 mg (60 mg Oral Given 03/12/21 1011)  albuterol (VENTOLIN HFA) 108 (90 Base) MCG/ACT inhaler 2 puff (2 puffs Inhalation Given 03/12/21 1020)    ED Course  I have reviewed the triage vital signs and the nursing notes.  Pertinent labs & imaging results that were available during my care of the patient were reviewed by me and considered in my medical decision making (see chart for details).    MDM Rules/Calculators/A&P  25 yo F with a chief complaints of a asthma exacerbation.  Using her inhaler much more often at home.  She is clear lung sounds here for me.  No tachypnea.  No tachycardia.  She does have signs of upper respiratory illness and it sounds like when she lays back flat she has been having some coughing especially at night.  More consistent with a viral syndrome.  As she has been using her inhaler more often than using just prior to arrival we will start her on burst of steroids.  We will have her follow-up with her family doctor.  10:28 AM:  I have discussed the diagnosis/risks/treatment options with the patient and believe the pt to be eligible for discharge home to follow-up with PCP. We also discussed returning to the ED immediately if new or worsening sx occur. We discussed the sx which are most concerning (e.g., sudden worsening pain, fever, inability to tolerate by mouth) that necessitate immediate return. Medications administered to the patient during their visit and any new prescriptions provided to the patient are listed below.  Medications given during this visit Medications  predniSONE (DELTASONE) tablet 60 mg (60 mg Oral Given 03/12/21 1011)  albuterol (VENTOLIN HFA) 108 (90 Base) MCG/ACT inhaler 2 puff (2 puffs Inhalation Given 03/12/21 1020)     The patient appears reasonably screen and/or stabilized for discharge and I doubt any other medical condition or other [facility] requiring further screening, evaluation, or treatment in the ED at this time  prior to discharge.   Final Clinical Impression(s) / ED Diagnoses Final diagnoses:  Viral upper respiratory tract infection    Rx / DC Orders ED Discharge Orders         Ordered    predniSONE (DELTASONE) 20 MG tablet  Status:  Discontinued        03/12/21 1008    predniSONE (DELTASONE) 20 MG tablet        03/12/21 1027           Melene Plan, DO 03/12/21 1028

## 2021-03-12 NOTE — Discharge Instructions (Addendum)
Use your inhaler every 4 hours(6 puffs) while awake, return for sudden worsening shortness of breath, or if you need to use your inhaler more often.  ° °

## 2021-03-12 NOTE — ED Triage Notes (Addendum)
Pt has hx of asthma. States has been needing to take her albuterol inhaler more frequently over the past week. Taking it daily and every few hours d/t SOB. C/o dry cough at night. States inhaler "temporarily helps symptoms"  States had COVID in January

## 2022-06-22 ENCOUNTER — Encounter (HOSPITAL_BASED_OUTPATIENT_CLINIC_OR_DEPARTMENT_OTHER): Payer: Self-pay | Admitting: Emergency Medicine

## 2022-06-22 ENCOUNTER — Emergency Department (HOSPITAL_BASED_OUTPATIENT_CLINIC_OR_DEPARTMENT_OTHER)
Admission: EM | Admit: 2022-06-22 | Discharge: 2022-06-22 | Disposition: A | Discharge status: Home / Self Care | Payer: Medicaid Other | Attending: Emergency Medicine | Admitting: Emergency Medicine

## 2022-06-22 DIAGNOSIS — H5789 Other specified disorders of eye and adnexa: Secondary | ICD-10-CM | POA: Diagnosis present

## 2022-06-22 DIAGNOSIS — H18892 Other specified disorders of cornea, left eye: Secondary | ICD-10-CM | POA: Diagnosis not present

## 2022-06-22 DIAGNOSIS — B9789 Other viral agents as the cause of diseases classified elsewhere: Secondary | ICD-10-CM | POA: Insufficient documentation

## 2022-06-22 DIAGNOSIS — H1032 Unspecified acute conjunctivitis, left eye: Secondary | ICD-10-CM | POA: Insufficient documentation

## 2022-06-22 DIAGNOSIS — Z91010 Allergy to peanuts: Secondary | ICD-10-CM | POA: Insufficient documentation

## 2022-06-22 DIAGNOSIS — B309 Viral conjunctivitis, unspecified: Secondary | ICD-10-CM

## 2022-06-22 MED ORDER — FLUORESCEIN SODIUM 1 MG OP STRP
1.0000 | ORAL_STRIP | Freq: Once | OPHTHALMIC | Status: AC
Start: 2022-06-22 — End: 2022-06-22
  Administered 2022-06-22: 1 via OPHTHALMIC
  Filled 2022-06-22: qty 1

## 2022-06-22 MED ORDER — TETRACAINE HCL 0.5 % OP SOLN
2.0000 [drp] | Freq: Once | OPHTHALMIC | Status: AC
  Administered 2022-06-22: 2 [drp] via OPHTHALMIC
  Filled 2022-06-22: qty 4

## 2022-06-22 NOTE — ED Triage Notes (Signed)
Pt reports last week she had light sensitivity on L eye that gradually got worse over a few days. Noticed a eyelash got on her eye at that time. This resolved, but same thing happened again today. Slight redness on sclera noted.

## 2022-06-22 NOTE — ED Provider Notes (Signed)
[facility] EMERGENCY DEPARTMENT Provider Note   CSN: [id number] Arrival date & time: 06/22/22  1600     History  Chief Complaint  Patient presents with   Eye Pain    [name] Cheryl Logan is a 26 y.o. female who presents the emergency department complaining of left eye irritation starting today.  Patient had similar symptoms last week and felt as though she got an eyelash stuck in her eye.  She had some pain in her eye going on for about 5 days.  This improved with ibuprofen.  She noticed today having similar symptoms, states that the pain feels as though she has "soap in her eye".  No fevers or chills.  Has had some sinus congestion related to allergies.   Eye Pain       Home Medications Prior to Admission medications   Medication Sig Start Date End Date Taking? Authorizing Provider  albuterol (PROAIR HFA) 108 (90 Base) MCG/ACT inhaler Inhale 2 puffs into the lungs every 6 (six) hours as needed for wheezing or shortness of breath. 09/05/16   Tegeler, Canary Brim, MD  metoCLOPramide (REGLAN) 10 MG tablet Take 1 tablet (10 mg total) by mouth every 6 (six) hours as needed for nausea (nausea/headache). 11/22/20   Charlynne Pander, MD  naproxen (NAPROSYN) 375 MG tablet Take 1 tablet twice daily as needed for chest wall pain. 11/14/20   Molpus, John, MD  predniSONE (DELTASONE) 20 MG tablet Take 2 tabs by mouth daily x 4 days 03/12/21   Melene Plan, DO  fluticasone [facility]) 50 MCG/ACT nasal spray Place 1 spray into both nostrils daily. 10/01/19 11/14/20  Emi Holes, PA-C  loratadine (CLARITIN) 10 MG tablet Take 1 tablet (10 mg total) by mouth daily. 03/13/17 11/14/20  Michela Pitcher A, PA-C      Allergies    Fish allergy, Shellfish allergy, Banana, and Peanut-containing drug products    Review of Systems   Review of Systems  Eyes:  Positive for photophobia, pain, discharge and redness.  All other systems reviewed and are negative.   Physical Exam Updated Vital Signs BP  (!) 128/90 (BP Location: Left Arm)   Pulse 84   Temp 98.1 F (36.7 C) (Oral)   Resp 16   SpO2 100%  Physical Exam Vitals and nursing note reviewed.  Constitutional:      Appearance: Normal appearance.  HENT:     Head: Normocephalic and atraumatic.  Eyes:     General: Lids are normal. Vision grossly intact.     Conjunctiva/sclera:     Left eye: Left conjunctiva is injected. No chemosis, exudate or hemorrhage.    Slit lamp exam:    Right eye: No corneal ulcer, foreign body or photophobia.     Left eye: Photophobia present. No corneal ulcer or foreign body.     Comments: Watery clear discharge from left eye  Pulmonary:     Effort: Pulmonary effort is normal. No respiratory distress.  Skin:    General: Skin is warm and dry.  Neurological:     Mental Status: She is alert.  Psychiatric:        Mood and Affect: Mood normal.        Behavior: Behavior normal.     ED Results / Procedures / Treatments   Labs (all labs ordered are listed, but only abnormal results are displayed) Labs Reviewed - No data to display  EKG None  Radiology No results found.  Procedures Procedures    Medications Ordered in  ED Medications  tetracaine (PONTOCAINE) 0.5 % ophthalmic solution 2 drop (2 drops Left Eye Given by Other 06/22/22 1759)  fluorescein ophthalmic strip 1 strip (1 strip Left Eye Given by Other 06/22/22 1759)    ED Course/ Medical Decision Making/ A&P                           Medical Decision Making Risk Prescription drug management.  Patient is a 26 year old female with a history of eczema, HSV, asthma, and GERD who presents the emergency department complaining of left eye irritation starting this morning with significant photophobia.  Similar symptoms last week, that lasted about 5 days.  Differential diagnosis includes: corneal abrasion or ulceration, conjunctivitis, orbital cellulitis  On exam patient has normal vital signs.  Afebrile.  Left eye conjunctival injection,  without chemosis or exudate.  Watery clear discharge.  No swelling or tenderness of the eyelids.  On [place] lamp exam with fluorescein, there is no uptake, corneal ulcers or abrasions noted.  Discussed with patient that I think her symptoms are related to likely viral conjunctivitis, may be brought on by initial foreign body.  I have low concern for orbital cellulitis as I believe that her pain with some EOMs is due more to a foreign body sensation.  There is no proptosis, tenderness/redness/swelling of the eyelids.  Will recommend artificial tears, cool compresses, ibuprofen for pain, and give close return precautions.  Patient agreeable to the plan.        Final Clinical Impression(s) / ED Diagnoses Final diagnoses:  Corneal irritation of left eye  Viral conjunctivitis of left eye    Rx / DC Orders ED Discharge Orders     None      Portions of this report may have been transcribed using voice recognition software. Every effort was made to ensure accuracy; however, inadvertent computerized transcription errors may be present.    Su Monks, PA-C 06/22/22 1855    Virgina Norfolk, DO 06/22/22 2111

## 2022-06-22 NOTE — Discharge Instructions (Addendum)
You were seen in the emergency department for eye irritation.   As we discussed, I think you likely scratched your eye and could be developing a viral infection.  I recommend using eye drops, cool compresses for comfort, and ibuprofen as needed for pain.   If your symptoms worsen in any way, please return to the ER for further evaluation.

## 2022-06-22 NOTE — ED Notes (Signed)
ED Provider at bedside. 

## 2022-06-23 ENCOUNTER — Ambulatory Visit: Payer: Self-pay | Admitting: *Deleted

## 2022-06-23 NOTE — Telephone Encounter (Signed)
  Chief Complaint: eye pain Symptoms: diagnosed with corneal irritation/scratch at ED Frequency: pain is worse this morning Pertinent Negatives: Patient denies fever Disposition: [x] ED /[] Urgent Care (no appt availability in office) / [] Appointment(In office/virtual)/ []  [place] [facility]/ [] Home Care/ [] Refused Recommended Disposition /[] [facility]/ []  Follow-up with PCP Additional Notes: Patient states no open appointment at patient PCP- advised ED follow up

## 2022-06-23 NOTE — Telephone Encounter (Signed)
Reason for Disposition  [1] SEVERE pain AND [2] not improved 2 hours after pain medicine/ice packs  Answer Assessment - Initial Assessment Questions 1. ONSET: "When did the pain start?" (e.g., minutes, hours, days)     Last night was same as when left ED- today it is worse 2. TIMING: "Does the pain come and go, or has it been constant since it started?" (e.g., constant, intermittent, fleeting)     constant 3. SEVERITY: "How bad is the pain?"   (Scale 1-10; mild, moderate or severe)   - MILD (1-3): doesn't interfere with normal activities    - MODERATE (4-7): interferes with normal activities or awakens from sleep    - SEVERE (8-10): excruciating pain and patient unable to do normal activities     Burning- feels like something is in the eye 4. LOCATION: "Where does it hurt?"  (e.g., eyelid, eye, cheekbone)     Left eye 5. CAUSE: "What do you think is causing the pain?"     Scratch on eye 6. VISION: "Do you have blurred vision or changes in your vision?"      no 7. EYE DISCHARGE: "Is there any discharge (pus) from the eye(s)?"  If Yes, ask: "What color is it?"      Same- clear 8. FEVER: "Do you have a fever?" If Yes, ask: "What is it, how was it measured, and when did it start?"      Has not checked 9. OTHER SYMPTOMS: "Do you have any other symptoms?" (e.g., headache, nasal discharge, facial rash)     Headache- mild 10. PREGNANCY: "Is there any chance you are pregnant?" "When was your last menstrual period?"       no  Protocols used: Eye Pain and Other Symptoms-A-AH, Eye Injury-A-AH

## 2022-06-24 ENCOUNTER — Emergency Department (HOSPITAL_BASED_OUTPATIENT_CLINIC_OR_DEPARTMENT_OTHER)
Admission: EM | Admit: 2022-06-24 | Discharge: 2022-06-24 | Disposition: A | Discharge status: Home / Self Care | Payer: Medicaid Other | Attending: Emergency Medicine | Admitting: Emergency Medicine

## 2022-06-24 ENCOUNTER — Other Ambulatory Visit: Payer: Self-pay

## 2022-06-24 ENCOUNTER — Encounter (HOSPITAL_BASED_OUTPATIENT_CLINIC_OR_DEPARTMENT_OTHER): Payer: Self-pay | Admitting: Emergency Medicine

## 2022-06-24 DIAGNOSIS — H16002 Unspecified corneal ulcer, left eye: Secondary | ICD-10-CM | POA: Insufficient documentation

## 2022-06-24 DIAGNOSIS — J45909 Unspecified asthma, uncomplicated: Secondary | ICD-10-CM | POA: Insufficient documentation

## 2022-06-24 DIAGNOSIS — Z91010 Allergy to peanuts: Secondary | ICD-10-CM | POA: Insufficient documentation

## 2022-06-24 DIAGNOSIS — H5712 Ocular pain, left eye: Secondary | ICD-10-CM | POA: Diagnosis present

## 2022-06-24 MED ORDER — TETRACAINE HCL 0.5 % OP SOLN
2.0000 [drp] | Freq: Once | OPHTHALMIC | Status: AC
  Administered 2022-06-24: 2 [drp] via OPHTHALMIC
  Filled 2022-06-24: qty 4

## 2022-06-24 MED ORDER — MOXIFLOXACIN HCL 0.5 % OP SOLN
1.0000 [drp] | OPHTHALMIC | 0 refills | Status: DC
Start: 2022-06-24 — End: 2023-04-22

## 2022-06-24 MED ORDER — FLUORESCEIN SODIUM 1 MG OP STRP
1.0000 | ORAL_STRIP | Freq: Once | OPHTHALMIC | Status: AC
  Administered 2022-06-24: 1 via OPHTHALMIC
  Filled 2022-06-24: qty 1

## 2022-06-24 MED ORDER — CYCLOPENTOLATE HCL 1 % OP SOLN: 1.0000 [drp] | Freq: Three times a day (TID) | OPHTHALMIC | 0 refills | Status: AC | PRN

## 2022-06-24 NOTE — Discharge Instructions (Addendum)
Continue using the medications provided by Dr. Darcel Bayley of ophthalmology as prescribed.  These include Moxifloxacin drops every hour around-the-clock, and Cyclopentolate every 8 hours as needed for photophobia.  These have been sent to the [facility] off of [address] and are to be started immediately.  Follow-up in his office tomorrow as discussed.  Return to the ED for any new or worsening symptoms as discussed.

## 2022-06-24 NOTE — Consult Note (Signed)
Ophthalmology Initial Consult Note   [place], [place] ([place]), 26 y.o. 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  She reports spontaneously developing this pain approximately 4 days ago.  It began with a foreign body sensation in the left eye, but has grown to become a severe pain.  She states that she would rather give birth again and then have this pain.  This is associated with photophobia and an inability to open the eyes.   She denies contact lens wear, antecedent trauma to developing this pain and blurry vision. She has worn artificial lashes in the recent past as well but removed these prior to the current event. She also has plucked several lashes that have been growing in from the left upper eyelid.  Past Ocular Hx:  none  Ocular Meds:  none Family ocular history: Denies glaucoma, AMD  Past Medical History:  Diagnosis Date   Asthma    Chlamydia    Eczema    GERD (gastroesophageal reflux disease)    HSV (herpes simplex virus) infection    history of-no current lesions   Pyelonephritis complicating pregnancy, antepartum 07/17/2014   Staph infection    Past Surgical History:  Procedure Laterality Date   CESAREAN SECTION N/A 10/10/2014   Procedure: CESAREAN SECTION;  Surgeon: Purcell Nails, MD;  Location: [facility];  Service: Obstetrics;  Laterality: N/A;   CESAREAN SECTION N/A 11/02/2016   Procedure: CESAREAN SECTION;  Surgeon: Jaymes Graff, MD;  Location: [facility];  Service: Obstetrics;  Laterality: N/A;   HERNIA REPAIR     NO PAST SURGERIES      Prior to Admission Meds: (Not in a hospital admission)   Inpatient Meds: Meds ordered this encounter  Medications   tetracaine (PONTOCAINE) 0.5 % ophthalmic solution 2 drop    fluorescein ophthalmic strip 1 strip     Allergies  Allergen Reactions   Fish Allergy Anaphylaxis   Shellfish Allergy Anaphylaxis   Banana Swelling and Other (See Comments)    Reaction:  Tongue/mouth swelling   Peanut-Containing Drug Products Swelling and Other (See Comments)    Reaction:  Tongue/mouth swelling   Social History   Tobacco Use   Smoking status: Never   Smokeless tobacco: Never  Substance Use Topics   Alcohol use: Yes    Comment: occ   History reviewed. No pertinent family history.  ROS: Other than ROS in the HPI, all other systems were negative.  Exam: Temp: 98.2 F (36.8 C) Pulse Rate: 84 BP: 120/87 Resp: 17 SpO2: 100 %  Visual Acuity:  [place]  cc  ph  near   OD  +     20/20   OS  +      20/20     OD OS  Confr Vis Fields WNL WNL  EOM (Primary) WNL WNL  Conjunctiva - Bulbar WNL WNL  Conjunctiva - Palpebral               WNL WNL  Adnexa  BUL distichiasis/trichiasis BUL distichiasis/trichiasis  Pupils  WNL WNL  Reaction, Direct WNL WNL                 Consensual WNL WNL                 RAPD  No No  Cornea  WNL Superotemporal dense corneal infiltrate with overlying 1 x 1.3 mm epi defect  Anterior Chamber WNL WNL  Lens:  WNL WNL  IOP 10 10  Fundus - Dilated? 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  It is located peripherally, so I recommend treatment with moxifloxacin every hour around-the-clock. Cyclopentolate q8h PRN photophobia.  Culture obtained of L cornea.  She should follow-up with [place] in 1 day for repeat exam. She will likely ultimately require  electrolysis of her BUL misdirected lashes with me as I suspect this or her artificial lashes are the source for her corneal abrasion/ulcer.   Cheryl Logan 06/24/2022, 8:11 PM Ophthalmic Plastic and Reconstructive Surgery [facility] [phone]

## 2022-06-24 NOTE — ED Provider Notes (Signed)
[facility] EMERGENCY DEPARTMENT Provider Note   CSN: [id number] Arrival date & time: 06/24/22  1330     History {Add pertinent medical, surgical, social history, OB history to HPI:1} Chief Complaint  Patient presents with   Eye Pain    Cheryl Logan is a 26 y.o. female returning to the ED today for worsening left eye pain.  Recently seen 2 days ago and diagnosed with viral conjunctivitis and corneal irritation.  Recommended to try artificial tears, cool compresses, and ibuprofen, however use of provided little to no relief.  Used to feel relief by closing her eyelid, now without relief with it open or closed, only feels relief when she is moving her eye around.  Denies fever, vision changes, or purulent discharge.  Endorses increased watering of the eye, worsening redness, and mild swelling of her eyelid.  The history is provided by the patient and medical records.  Eye Pain       Home Medications Prior to Admission medications   Medication Sig Start Date End Date Taking? Authorizing Provider  albuterol (PROAIR HFA) 108 (90 Base) MCG/ACT inhaler Inhale 2 puffs into the lungs every 6 (six) hours as needed for wheezing or shortness of breath. 09/05/16   Tegeler, Canary Brim, MD  metoCLOPramide (REGLAN) 10 MG tablet Take 1 tablet (10 mg total) by mouth every 6 (six) hours as needed for nausea (nausea/headache). 11/22/20   Charlynne Pander, MD  naproxen (NAPROSYN) 375 MG tablet Take 1 tablet twice daily as needed for chest wall pain. 11/14/20   Molpus, John, MD  predniSONE (DELTASONE) 20 MG tablet Take 2 tabs by mouth daily x 4 days 03/12/21   Melene Plan, DO  fluticasone [facility]) 50 MCG/ACT nasal spray Place 1 spray into both nostrils daily. 10/01/19 11/14/20  Emi Holes, PA-C  loratadine (CLARITIN) 10 MG tablet Take 1 tablet (10 mg total) by mouth daily. 03/13/17 11/14/20  Michela Pitcher A, PA-C      Allergies    Fish allergy, Shellfish allergy, Banana, and  Peanut-containing drug products    Review of Systems   Review of Systems  Eyes:  Positive for pain.    Physical Exam Updated Vital Signs BP (!) 117/96 (BP Location: Right Arm)   Pulse 76   Temp 98 F (36.7 C) (Oral)   Resp 16   SpO2 100%  Physical Exam Vitals and nursing note reviewed.  Constitutional:      General: She is not in acute distress.    Appearance: She is well-developed.  HENT:     Head: Normocephalic and atraumatic.  Eyes:     General: Vision grossly intact. Gaze aligned appropriately. No visual field deficit.       Left eye: No hordeolum.     Extraocular Movements: Extraocular movements intact.     Right eye: No nystagmus.     Left eye: No nystagmus.     Conjunctiva/sclera: Conjunctivae normal.     Left eye: Chemosis present. No exudate or hemorrhage.    Pupils: Pupils are equal, round, and reactive to light.     Left eye: Fluorescein uptake (Circular area about 52mm in diameter) present.     Visual Fields: Right eye visual fields normal and left eye visual fields normal.     Right eye: CF in the upper temporal quadrant. CF in the upper nasal quadrant. CF in the lower temporal quadrant. CF in the lower nasal quadrant.     Left eye: CF in the upper nasal quadrant.  CF in the upper temporal quadrant. CF in the lower nasal quadrant. CF in the lower temporal quadrant.     Comments: Mild edema of left eyelid.  No evidence of hypopyon, hordeolum, or hemorrhage.  No dendritic pattern on fluorescein uptake.    Cardiovascular:     Rate and Rhythm: Normal rate and regular rhythm.     Heart sounds: No murmur heard. Pulmonary:     Effort: Pulmonary effort is normal. No respiratory distress.     Breath sounds: Normal breath sounds.  Abdominal:     Palpations: Abdomen is soft.     Tenderness: There is no abdominal tenderness.  Musculoskeletal:        General: No swelling.     Cervical back: Neck supple.  Skin:    General: Skin is warm and dry.     Capillary Refill:  Capillary refill takes less than 2 seconds.  Neurological:     Mental Status: She is alert.  Psychiatric:        Mood and Affect: Mood normal.     ED Results / Procedures / Treatments   Labs (all labs ordered are listed, but only abnormal results are displayed) Labs Reviewed - No data to display  EKG None  Radiology No results found.  Procedures Procedures  {Document cardiac monitor, telemetry assessment procedure when appropriate:1}  Medications Ordered in ED Medications - No data to display  ED Course/ Medical Decision Making/ A&P                           Medical Decision Making  26 y.o. female presents to the ED for concern of Eye Pain   This involves an extensive number of treatment options, and is a complaint that carries with it a high risk of complications and morbidity.  The emergent differential diagnosis prior to evaluation includes, but is not limited to: corneal ulcer, corneal abrasion, keratitis, hypopyon   This is not an exhaustive differential.   Past Medical History / Co-morbidities / Social History: Hx of HSV, asthma, eczema Social Determinants of Health include: None  Additional History:  Obtained by chart review.  Notably recent ED visit without fluorescein uptake.  Lab Tests: None  Imaging Studies: None  ED Course: Pt well-appearing on exam.  ***.   PERRLA.  Circular fluorescein uptake on Woods lamp exam, clinically suggestive of corneal ulcer.  Visual fields normal.  EOMs grossly intact. Patient in NAD and in good condition at time of discharge.  Disposition: After consideration the patient's encounter today, I do not feel today's workup suggests an emergent condition requiring admission or immediate intervention beyond what has been performed at this time.  Safe for discharge; instructed to return immediately for worsening symptoms, change in symptoms or any other concerns.  I have reviewed the patients home medicines and have made  adjustments as needed.  Discussed course of treatment with the patient, whom demonstrated understanding.  Patient in agreement and has no further questions.    I discussed this case with my attending physician Dr. ***, who agreed with the proposed treatment course and cosigned this note including patient's presenting symptoms, physical exam, and planned diagnostics and interventions.  Attending physician stated agreement with plan or made changes to plan which were implemented.     This chart was dictated using voice recognition software.  Despite best efforts to proofread, errors can occur which can change the documentation meaning.     {Document critical  care time when appropriate:1} {Document review of labs and clinical decision tools ie heart score, Chads2Vasc2 etc:1}  {Document your independent review of radiology images, and any outside records:1} {Document your discussion with family members, caretakers, and with consultants:1} {Document social determinants of health affecting pt's care:1} {Document your decision making why or why not admission, treatments were needed:1} Final Clinical Impression(s) / ED Diagnoses Final diagnoses:  None    Rx / DC Orders ED Discharge Orders     None

## 2022-06-24 NOTE — ED Triage Notes (Signed)
Left eye continuous pain and irritation . Was seen 2 days ago.  Here for referral for an ophthalmologist .

## 2022-06-27 LAB — AEROBIC CULTURE W GRAM STAIN (SUPERFICIAL SPECIMEN)
Culture: NO GROWTH
Gram Stain: NONE SEEN

## 2023-02-24 ENCOUNTER — Ambulatory Visit: Payer: Self-pay

## 2023-04-22 ENCOUNTER — Telehealth: Payer: Medicaid Other | Admitting: Nurse Practitioner

## 2023-04-22 DIAGNOSIS — H01001 Unspecified blepharitis right upper eyelid: Secondary | ICD-10-CM

## 2023-04-22 DIAGNOSIS — H1031 Unspecified acute conjunctivitis, right eye: Secondary | ICD-10-CM

## 2023-04-22 MED ORDER — OFLOXACIN 0.3 % OP SOLN: 1.0000 [drp] | Freq: Four times a day (QID) | OPHTHALMIC | 0 refills | Status: AC

## 2023-04-22 MED ORDER — BACITRACIN-POLYMYXIN B 500-10000 UNIT/GM OP OINT
1.0000 | TOPICAL_OINTMENT | Freq: Two times a day (BID) | OPHTHALMIC | 0 refills | Status: AC
Start: 2023-04-22 — End: 2023-04-27

## 2023-04-22 NOTE — Progress Notes (Signed)
Virtual Visit Consent   [place], you are scheduled for a virtual visit with a [facility] provider today. Just as with appointments in the office, your consent must be obtained to participate. Your consent will be active for this visit and any virtual visit you may have with one of our providers in the next 365 days. If you have a MyChart account, a copy of this consent can be sent to you electronically.  As this is a virtual visit, video technology does not allow for your provider to perform a traditional examination. This may limit your provider's ability to fully assess your condition. If your provider identifies any concerns that need to be evaluated in person or the need to arrange testing (such as labs, EKG, etc.), we will make arrangements to do so. Although advances in technology are sophisticated, we cannot ensure that it will always work on either your end or our end. If the connection with a video visit is poor, the visit may have to be switched to a telephone visit. With either a video or telephone visit, we are not always able to ensure that we have a secure connection.  By engaging in this virtual visit, you consent to the provision of healthcare and authorize for your insurance to be billed (if applicable) for the services provided during this visit. Depending on your insurance coverage, you may receive a charge related to this service.  I need to obtain your verbal consent now. Are you willing to proceed with your visit today? Cheryl Logan has provided verbal consent on 04/22/2023 for a virtual visit (video or telephone). Viviano Simas, FNP  Date: 04/22/2023 11:11 AM  Virtual Visit via Video Note   I, Viviano Simas, connected with  Cheryl Logan  ([id number], 1995/12/02) on 04/22/23 at 11:15 AM EDT by a video-enabled telemedicine application and verified that I am speaking with the correct person using two identifiers.  Location: Patient: Virtual Visit Location  Patient: Home Provider: Virtual Visit Location Provider: Home Office   I discussed the limitations of evaluation and management by telemedicine and the availability of in person appointments. The patient expressed understanding and agreed to proceed.    History of Present Illness: Cheryl Logan is a 27 y.o. who identifies as a female who was assigned female at birth, and is being seen today with complaints of crusted right eye this morning  She does wear press on eye lashes and she did remove those this morning as well   She did use allergy eye drops today as well for relief.   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   Multiple food allergies--banana, fish oil, peanut 10/09/2014   Asthma, chronic 10/09/2014   Eczema 10/09/2014   GBS (group B streptococcus) UTI complicating pregnancy--07/16/14 08/07/2014   Short cervix 07/18/2014   Pyelonephritis complicating pregnancy, antepartum 07/17/2014    Allergies:  Allergies  Allergen Reactions   Fish Allergy Anaphylaxis   Shellfish Allergy Anaphylaxis   Banana Swelling and Other (See Comments)    Reaction:  Tongue/mouth swelling   Peanut-Containing Drug Products Swelling and Other (See Comments)    Reaction:  Tongue/mouth swelling   Medications:  Current Outpatient Medications:    albuterol (PROAIR  HFA) 108 (90 Base) MCG/ACT inhaler, Inhale 2 puffs into the lungs every 6 (six) hours as needed for wheezing or shortness of breath., Disp: 1 Inhaler, Rfl: 0   cyclopentolate (CYCLODRYL,CYCLOGYL) 1 % ophthalmic solution, Place 1 drop into the left eye every 8 (eight) hours as needed., Disp: 2 mL, Rfl: 0   moxifloxacin (VIGAMOX) 0.5 % ophthalmic solution, Place 1 drop  into the left eye every hour., Disp: 3 mL, Rfl: 0   naproxen (NAPROSYN) 375 MG tablet, Take 1 tablet twice daily as needed for chest wall pain., Disp: 20 tablet, Rfl: 0   predniSONE (DELTASONE) 20 MG tablet, Take 2 tabs by mouth daily x 4 days, Disp: 8 tablet, Rfl: 0  Observations/Objective: Patient is well-developed, well-nourished in no acute distress.  Resting comfortably  at home.  Head is normocephalic, atraumatic.  No labored breathing.  Speech is clear and coherent with logical content.  Patient is alert and oriented at baseline.    Assessment and Plan: 1. Blepharitis of right upper eyelid, unspecified type  - bacitracin-polymyxin b (POLYSPORIN) ophthalmic ointment; Place 1 Application into the right eye 2 (two) times daily for 5 days.  Dispense: 3.5 g; Refill: 0  2. Acute bacterial conjunctivitis of right eye  - ofloxacin (OCUFLOX) 0.3 % ophthalmic solution; Place 1 drop into the right eye 4 (four) times daily for 5 days.  Dispense: 5 mL; Refill: 0    Warm compress and Advil for additional relief  Recommended removing temporary eyelashes for full recovery   Follow Up Instructions: I discussed the assessment and treatment plan with the patient. The patient was provided an opportunity to ask questions and all were answered. The patient agreed with the plan and demonstrated an understanding of the instructions.  A copy of instructions were sent to the patient via MyChart unless otherwise noted below.    The patient was advised to call back or seek an in-person evaluation if the symptoms worsen or if the condition fails to improve as anticipated.  Time:  I spent 15 minutes with the patient via telehealth technology discussing the above problems/concerns.    Viviano Simas, FNP

## 2023-07-30 ENCOUNTER — Other Ambulatory Visit: Payer: Self-pay

## 2023-07-30 ENCOUNTER — Encounter (HOSPITAL_BASED_OUTPATIENT_CLINIC_OR_DEPARTMENT_OTHER): Payer: Self-pay | Admitting: Emergency Medicine

## 2023-07-30 ENCOUNTER — Emergency Department (HOSPITAL_BASED_OUTPATIENT_CLINIC_OR_DEPARTMENT_OTHER)
Admission: EM | Admit: 2023-07-30 | Discharge: 2023-07-30 | Disposition: A | Discharge status: Home / Self Care | Payer: Medicaid Other | Attending: Emergency Medicine | Admitting: Emergency Medicine

## 2023-07-30 DIAGNOSIS — T7840XA Allergy, unspecified, initial encounter: Secondary | ICD-10-CM | POA: Insufficient documentation

## 2023-07-30 DIAGNOSIS — Z91010 Allergy to peanuts: Secondary | ICD-10-CM | POA: Diagnosis not present

## 2023-07-30 MED ORDER — FAMOTIDINE 20 MG PO TABS
20.0000 mg | ORAL_TABLET | Freq: Every day | ORAL | 0 refills | Status: AC
Start: 2023-07-30 — End: ?

## 2023-07-30 MED ORDER — FAMOTIDINE IN NACL 20-0.9 MG/50ML-% IV SOLN
20.0000 mg | Freq: Once | INTRAVENOUS | Status: AC
  Administered 2023-07-30: 20 mg via INTRAVENOUS
  Filled 2023-07-30: qty 50

## 2023-07-30 MED ORDER — EPINEPHRINE 0.3 MG/0.3ML IJ SOAJ
0.3000 mg | INTRAMUSCULAR | 0 refills | Status: AC | PRN
Start: 2023-07-30 — End: ?

## 2023-07-30 MED ORDER — FAMOTIDINE 20 MG PO TABS: 20.0000 mg | ORAL_TABLET | Freq: Every day | ORAL | 0 refills | Status: DC

## 2023-07-30 MED ORDER — CETIRIZINE HCL 10 MG PO TABS: 10.0000 mg | ORAL_TABLET | Freq: Every day | ORAL | 0 refills | Status: DC

## 2023-07-30 MED ORDER — EPINEPHRINE 0.3 MG/0.3ML IJ SOAJ: 0.3000 mg | INTRAMUSCULAR | 0 refills | Status: DC | PRN

## 2023-07-30 MED ORDER — METHYLPREDNISOLONE SODIUM SUCC 125 MG IJ SOLR
125.0000 mg | Freq: Once | INTRAMUSCULAR | Status: AC
  Administered 2023-07-30: 125 mg via INTRAVENOUS
  Filled 2023-07-30: qty 2

## 2023-07-30 NOTE — ED Notes (Signed)
D/c paperwork reviewed with pt, including prescriptions and follow up care.  All questions and/or concerns addressed at time of d/c.  No further needs expressed. . Pt verbalized understanding, Ambulatory with family to ED exit, NAD.

## 2023-07-30 NOTE — ED Provider Notes (Signed)
EMERGENCY DEPARTMENT AT [facility] [place] Provider Note   CSN: [id number] Arrival date & time: 07/30/23  1618     History  Chief Complaint  Patient presents with   Allergic Reaction    Cheryl Logan is a 27 y.o. 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  States she thought she was eating chocolate earlier and was noted that there is a peanut in it.  States that she initially had some lip swelling, tightness in throat and chest as well as rash.  Her lip swelling and rash improved after taking antihistamine.  She still has some tightness to her throat.  No nausea, vomiting, shortness of breath, sensation of throat closing, difficulty handling secretions.  States she has never had anaphylactic reaction to peanuts however she does a pretty good job of staying away from her known allergens.  HPI     Home Medications Prior to Admission medications   Medication Sig Start Date End Date Taking? Authorizing Provider  cetirizine (ZYRTEC ALLERGY) 10 MG tablet Take 1 tablet (10 mg total) by mouth daily. 07/30/23  Yes Emilene Roma A, PA-C  EPINEPHrine 0.3 mg/0.3 mL IJ SOAJ injection Inject 0.3 mg into the muscle as needed for anaphylaxis. 07/30/23  Yes Onesty Clair A, PA-C  famotidine (PEPCID) 20 MG tablet Take 1 tablet (20 mg total) by mouth daily. 07/30/23  Yes Cayleb Jarnigan A, PA-C  albuterol (PROAIR HFA) 108 (90 Base) MCG/ACT inhaler Inhale 2 puffs into the lungs every 6 (six) hours as needed for wheezing or shortness of breath. 09/05/16   Tegeler, Canary Brim, MD  cyclopentolate (CYCLODRYL,CYCLOGYL) 1 % ophthalmic solution Place 1 drop into the left eye every 8 (eight) hours as needed. 06/24/22   Cecil Cobbs, PA-C  naproxen (NAPROSYN) 375 MG tablet Take 1 tablet twice daily as needed for chest wall pain. 11/14/20   Molpus, John, MD  predniSONE (DELTASONE) 20 MG tablet Take 2 tabs by mouth daily x 4  days 03/12/21   Melene Plan, DO  fluticasone [facility]) 50 MCG/ACT nasal spray Place 1 spray into both nostrils daily. 10/01/19 11/14/20  Emi Holes, PA-C  loratadine (CLARITIN) 10 MG tablet Take 1 tablet (10 mg total) by mouth daily. 03/13/17 11/14/20  Michela Pitcher A, PA-C      Allergies    Fish allergy, Shellfish allergy, Banana, and Peanut-containing drug products    Review of Systems   Review of Systems  Constitutional: Negative.   HENT:  Positive for sore throat.   Respiratory:  Positive for chest tightness (resolved).   Cardiovascular: Negative.   Gastrointestinal: Negative.   Genitourinary: Negative.   Musculoskeletal: Negative.   Skin:  Positive for rash (resolved).  Neurological: Negative.   All other systems reviewed and are negative.   Physical Exam Updated Vital Signs BP 116/74 (BP Location: Left Arm)   Pulse (!) 102   Temp 98.1 F (36.7 C) (Oral)   Resp 20   Ht 5\' 2"  (1.575 m)   Wt 68 kg   SpO2 100%   BMI 27.42 kg/m  Physical Exam Vitals and nursing note reviewed.  Constitutional:      General: She is not in acute distress.    Appearance: She is well-developed. She is not ill-appearing, toxic-appearing or diaphoretic.  HENT:     Head: Normocephalic and atraumatic.     Jaw: There is normal jaw occlusion.     Comments: No angioedema, no drooling, dysphagia  or trismus    Mouth/Throat:     Lips: Pink.     Mouth: Mucous membranes are moist.     Pharynx: Oropharynx is clear. Uvula midline.     Comments: Posterior pharynx clear.  Tongue midline.  No pooling of secretions.  No intraoral lesions.  No tongue edema Eyes:     Pupils: Pupils are equal, round, and reactive to light.  Neck:     Trachea: Trachea and phonation normal.     Comments: Range of motion without difficulty, normal phonation.  Cardiovascular:     Rate and Rhythm: Normal rate.     Pulses: Normal pulses.          Radial pulses are 2+ on the right side and 2+ on the left side.     Heart  sounds: Normal heart sounds.  Pulmonary:     Effort: Pulmonary effort is normal. No respiratory distress.     Breath sounds: Normal breath sounds and air entry.     Comments: Clear bilaterally, speaks in full sentences without difficulty.  No wheeze, stridor Abdominal:     General: Bowel sounds are normal. There is no distension.     Palpations: Abdomen is soft.  Musculoskeletal:        General: Normal range of motion.     Cervical back: Full passive range of motion without pain and normal range of motion.  Skin:    General: Skin is warm and dry.     Capillary Refill: Capillary refill takes less than 2 seconds.     Comments: No obvious rash or lesions to exposed skin  Neurological:     General: No focal deficit present.     Mental Status: She is alert.  Psychiatric:        Mood and Affect: Mood normal.     ED Results / Procedures / Treatments   Labs (all labs ordered are listed, but only abnormal results are displayed) Labs Reviewed - No data to display  EKG None  Radiology No results found.  Procedures Procedures    Medications Ordered in ED Medications  famotidine (PEPCID) IVPB 20 mg premix (0 mg Intravenous Stopped 07/30/23 1722)  methylPREDNISolone sodium succinate (SOLU-MEDROL) 125 mg/2 mL injection 125 mg (125 mg Intravenous Given 07/30/23 1653)    ED Course/ Medical Decision Making/ A&P   27 year old history of anaphylaxis here for evaluation of allergic reaction.  Accidentally exposed to peanuts.  She did antihistamine at home.  States her angioedema and rash improved at home however she still feels some tightness in her throat.  She is tolerating her secretions.  She has no phonation changes.  Her heart and lungs are clear.  Abdomen soft nontender.  She has no obvious urticaria.  No angioedema.  Will hold on EpiPen.  Will plan on steroids, Pepcid and close observation  Patient reassessed. Sleeping will continue to monitor   Patient reassessed.  She is  asymptomatic.  Requesting discharge home.  Will write for EpiPen as she states she does not have at home.  Encourage antihistamine, Pepcid at home, will have her return for new or worsening symptoms.  The patient has been appropriately medically screened and/or stabilized in the ED. I have low suspicion for any other emergent medical condition which would require further screening, evaluation or treatment in the ED or require inpatient management.  Patient is hemodynamically stable and in no acute distress.  Patient able to ambulate in department prior to ED.  Evaluation does not show  acute pathology that would require ongoing or additional emergent interventions while in the emergency department or further inpatient treatment.  I have discussed the diagnosis with the patient and answered all questions.  Pain is been managed while in the emergency department and patient has no further complaints prior to discharge.  Patient is comfortable with plan discussed in room and is stable for discharge at this time.  I have discussed strict return precautions for returning to the emergency department.  Patient was encouraged to follow-up with PCP/specialist refer to at discharge.                                 Medical Decision Making Amount and/or Complexity of Data Reviewed Independent Historian: friend External Data Reviewed: labs, radiology and notes.  Risk OTC drugs. Prescription drug management. Decision regarding hospitalization. Diagnosis or treatment significantly limited by social determinants of health.           Final Clinical Impression(s) / ED Diagnoses Final diagnoses:  Allergic reaction, initial encounter    Rx / DC Orders ED Discharge Orders          Ordered    EPINEPHrine 0.3 mg/0.3 mL IJ SOAJ injection  As needed        07/30/23 1743    cetirizine (ZYRTEC ALLERGY) 10 MG tablet  Daily        07/30/23 1743    famotidine (PEPCID) 20 MG tablet  Daily        07/30/23  1743              Yacob Wilkerson A, PA-C 07/30/23 1745    Vanetta Mulders, MD 07/31/23 1343

## 2023-07-30 NOTE — ED Triage Notes (Signed)
States is allergic to peanuts and by accident ate a piece of candy thinking it was chocloate  and it had peanut butter in it . Pt handling  secreations well but states  her chest and throat are tight

## 2023-07-30 NOTE — Discharge Instructions (Signed)
It was a pleasure taking care of you here in the emergency department  We have given you medications for your allergic reaction.  I have written you for an EpiPen.  If you develop facial swelling, difficulty breathing, sensation of throat closing please use the EpiPen.  If you have to use the EpiPen you need to be seen the emergency department to be observed.  I would recommend an antihistamine such as Zyrtec, Claritin over the next week.  Was also recommend taking Pepcid.  Return for new or worsening symptoms

## 2024-08-16 ENCOUNTER — Telehealth: Admitting: Physician Assistant

## 2024-08-16 DIAGNOSIS — H01002 Unspecified blepharitis right lower eyelid: Secondary | ICD-10-CM

## 2024-08-16 MED ORDER — TOBRAMYCIN-DEXAMETHASONE 0.3-0.1 % OP SUSP
OPHTHALMIC | 0 refills | Status: AC
Start: 2024-08-16 — End: ?

## 2024-08-16 NOTE — Patient Instructions (Signed)
  Cheryl Logan, thank you for joining Cheryl Velma Lunger, PA-C for today's virtual visit.  While this provider is not your primary care provider (PCP), if your PCP is located in our provider database this encounter information will be shared with them immediately following your visit.   A [facility] MyChart account gives you access to today's visit and all your visits, tests, and labs performed at [facility]  click here if you don't have a [facility] MyChart account or go to [email or web address]  Consent: (Patient) Cheryl Logan provided verbal consent for this virtual visit at the beginning of the encounter.  Current Medications:  Current Outpatient Medications:    albuterol  (PROAIR  HFA) 108 (90 Base) MCG/ACT inhaler, Inhale 2 puffs into the lungs every 6 (six) hours as needed for wheezing or shortness of breath., Disp: 1 Inhaler, Rfl: 0   cetirizine  (ZYRTEC  ALLERGY) 10 MG tablet, Take 1 tablet (10 mg total) by mouth daily., Disp: 30 tablet, Rfl: 0   cyclopentolate  (CYCLODRYL,CYCLOGYL ) 1 % ophthalmic solution, Place 1 drop into the left eye every 8 (eight) hours as needed., Disp: 2 mL, Rfl: 0   EPINEPHrine  0.3 mg/0.3 mL IJ SOAJ injection, Inject 0.3 mg into the muscle as needed for anaphylaxis., Disp: 1 each, Rfl: 0   famotidine  (PEPCID ) 20 MG tablet, Take 1 tablet (20 mg total) by mouth daily., Disp: 30 tablet, Rfl: 0   naproxen  (NAPROSYN ) 375 MG tablet, Take 1 tablet twice daily as needed for chest wall pain., Disp: 20 tablet, Rfl: 0   predniSONE  (DELTASONE ) 20 MG tablet, Take 2 tabs by mouth daily x 4 days, Disp: 8 tablet, Rfl: 0   Medications ordered in this encounter:  No orders of the defined types were placed in this encounter.    *If you need refills on other medications prior to your next appointment, please contact your pharmacy*  Follow-Up: Call back or seek an in-person evaluation if the symptoms worsen or if the condition fails to improve as  anticipated.  [facility] [phone]  Other Instructions Please apply compresses as discussed. Continue your allergy medication regimen. Remove contact lenses until this is resolved. Apply eye drops as directed. If you note any non-resolving, new, or worsening symptoms despite treatment, please seek an in-person evaluation ASAP.    If you have been instructed to have an in-person evaluation today at a local Urgent Care facility, please use the link below. It will take you to a list of all of our available [facility], including address, phone number and hours of operation. Please do not delay care.  [facility]  If you or a family member do not have a primary care provider, use the link below to schedule a visit and establish care. When you choose a [facility] primary care physician or advanced practice provider, you gain a long-term partner in health. Find a Primary Care Provider  Learn more about [facility]'s in-office and virtual care options: [facility] - Get Care Now

## 2024-08-16 NOTE — Progress Notes (Signed)
 Virtual Visit Consent   Cher[place], you are scheduled for a virtual visit with a [facility] provider today. Just as with appointments in the office, your consent must be obtained to participate. Your consent will be active for this visit and any virtual visit you may have with one of our providers in the next 365 days. If you have a MyChart account, a copy of this consent can be sent to you electronically.  As this is a virtual visit, video technology does not allow for your provider to perform a traditional examination. This may limit your provider's ability to fully assess your condition. If your provider identifies any concerns that need to be evaluated in person or the need to arrange testing (such as labs, EKG, etc.), we will make arrangements to do so. Although advances in technology are sophisticated, we cannot ensure that it will always work on either your end or our end. If the connection with a video visit is poor, the visit may have to be switched to a telephone visit. With either a video or telephone visit, we are not always able to ensure that we have a secure connection.  By engaging in this virtual visit, you consent to the provision of healthcare and authorize for your insurance to be billed (if applicable) for the services provided during this visit. Depending on your insurance coverage, you may receive a charge related to this service.  I need to obtain your verbal consent now. Are you willing to proceed with your visit today? Cheryl Logan has provided verbal consent on 08/16/2024 for a virtual visit (video or telephone). Cheryl Logan, [place]  Date: 08/16/2024 10:42 AM   Virtual Visit via Video Note   I, Cheryl Logan, connected with  Cheryl Logan  ([id number], 09-21-1996) on 08/16/24 at 10:45 AM EDT by a video-enabled telemedicine application and verified that I am speaking with the correct person using two identifiers.  Location: Patient: Virtual  Visit Location Patient: Home Provider: Virtual Visit Location Provider: Home Office   I discussed the limitations of evaluation and management by telemedicine and the availability of in person appointments. The patient expressed understanding and agreed to proceed.    History of Present Illness: Cheryl Logan is a 28 y.o. who identifies as a female who was assigned female at birth, and is being seen today for 2 days of R lower eyelid swelling with tenderness. Denies any noted drainage from the area so far. Denies fevers, chills. Denies vision changes. Does wear contact lenses.   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   Multiple food allergies--banana, fish oil, peanut 10/09/2014   Asthma, chronic 10/09/2014   Eczema 10/09/2014   GBS (group B streptococcus) UTI complicating pregnancy--07/16/14 08/07/2014   Short cervix 07/18/2014   Pyelonephritis complicating pregnancy, antepartum 07/17/2014    Allergies:  Allergies  Allergen Reactions   Fish Allergy Anaphylaxis   Shellfish Allergy Anaphylaxis   Banana Swelling and Other (See Comments)    Reaction:  Tongue/mouth swelling   Peanut-Containing Drug Products Swelling and Other (See Comments)    Reaction:  Tongue/mouth swelling   Medications:  Current Outpatient Medications:    tobramycin-dexamethasone  (TOBRADEX) ophthalmic solution, Apply 1-2 drops to affected eye every 4 hours while awake for 5 days., Disp:  5 mL, Rfl: 0   albuterol  (PROAIR  HFA) 108 (90 Base) MCG/ACT inhaler, Inhale 2 puffs into the lungs every 6 (six) hours as needed for wheezing or shortness of breath., Disp: 1 Inhaler, Rfl: 0   cyclopentolate  (CYCLODRYL,CYCLOGYL ) 1 % ophthalmic solution, Place 1 drop into the left eye every 8 (eight) hours as  needed., Disp: 2 mL, Rfl: 0   EPINEPHrine  0.3 mg/0.3 mL IJ SOAJ injection, Inject 0.3 mg into the muscle as needed for anaphylaxis., Disp: 1 each, Rfl: 0   famotidine  (PEPCID ) 20 MG tablet, Take 1 tablet (20 mg total) by mouth daily., Disp: 30 tablet, Rfl: 0  Observations/Objective: Patient is well-developed, well-nourished in no acute distress.  Resting comfortably  at home.  Head is normocephalic, atraumatic.  No labored breathing.  Speech is clear and coherent with logical content.  Patient is alert and oriented at baseline.  R lower eyelid swelling without noted stye. No periorbital erythema noted. Conjunctiva within normal limits.  Assessment and Plan: 1. Blepharitis of right lower eyelid, unspecified type (Primary) - tobramycin-dexamethasone  (TOBRADEX) ophthalmic solution; Apply 1-2 drops to affected eye every 4 hours while awake for 5 days.  Dispense: 5 mL; Refill: 0  Supportive measures and OTC medications reviewed. Compresses as directed. Remove contact lenses until treatment complete. Tobradex per orders.  Follow Up Instructions: I discussed the assessment and treatment plan with the patient. The patient was provided an opportunity to ask questions and all were answered. The patient agreed with the plan and demonstrated an understanding of the instructions.  A copy of instructions were sent to the patient via MyChart unless otherwise noted below.   The patient was advised to call back or seek an in-person evaluation if the symptoms worsen or if the condition fails to improve as anticipated.    Cheryl Velma Lunger, PA-C
